# Patient Record
Sex: Female | Born: 1935
Health system: Southern US, Community
[De-identification: ages and names within clinical notes are randomized; demographics above are authoritative.]

## PROBLEM LIST (undated history)

## (undated) ENCOUNTER — Emergency Department (HOSPITAL_COMMUNITY): Admission: EM | Payer: Self-pay | Source: Home / Self Care

## (undated) DIAGNOSIS — E785 Hyperlipidemia, unspecified: Secondary | ICD-10-CM

## (undated) DIAGNOSIS — M1990 Unspecified osteoarthritis, unspecified site: Secondary | ICD-10-CM

## (undated) DIAGNOSIS — I10 Essential (primary) hypertension: Secondary | ICD-10-CM

## (undated) HISTORY — PX: THYROIDECTOMY, PARTIAL: SHX18

## (undated) HISTORY — PX: CHOLECYSTECTOMY: SHX55

## (undated) HISTORY — PX: APPENDECTOMY: SHX54

---

## 2001-11-06 ENCOUNTER — Emergency Department (HOSPITAL_COMMUNITY): Admission: EM | Admit: 2001-11-06 | Discharge: 2001-11-06 | Payer: Self-pay | Admitting: Emergency Medicine

## 2010-08-23 ENCOUNTER — Inpatient Hospital Stay (INDEPENDENT_AMBULATORY_CARE_PROVIDER_SITE_OTHER)
Admission: RE | Admit: 2010-08-23 | Discharge: 2010-08-23 | Disposition: A | Discharge status: Home / Self Care | Payer: Medicare Other | Source: Ambulatory Visit | Attending: Family Medicine | Admitting: Family Medicine

## 2010-08-23 DIAGNOSIS — E785 Hyperlipidemia, unspecified: Secondary | ICD-10-CM

## 2010-08-23 DIAGNOSIS — I10 Essential (primary) hypertension: Secondary | ICD-10-CM

## 2010-08-23 DIAGNOSIS — F411 Generalized anxiety disorder: Secondary | ICD-10-CM

## 2010-08-23 LAB — POCT I-STAT, CHEM 8
BUN: 12 mg/dL (ref 6–23)
Chloride: 102 mEq/L (ref 96–112)
Creatinine, Ser: 1 mg/dL (ref 0.50–1.35)
Potassium: 3.8 mEq/L (ref 3.5–5.1)
Sodium: 142 mEq/L (ref 135–145)

## 2010-12-24 ENCOUNTER — Emergency Department (HOSPITAL_COMMUNITY)
Admission: EM | Admit: 2010-12-24 | Discharge: 2010-12-24 | Disposition: A | Discharge status: Home / Self Care | Payer: Medicare Other | Attending: Emergency Medicine | Admitting: Emergency Medicine

## 2010-12-24 DIAGNOSIS — M79609 Pain in unspecified limb: Secondary | ICD-10-CM | POA: Insufficient documentation

## 2010-12-24 DIAGNOSIS — E789 Disorder of lipoprotein metabolism, unspecified: Secondary | ICD-10-CM | POA: Insufficient documentation

## 2010-12-24 DIAGNOSIS — M25519 Pain in unspecified shoulder: Secondary | ICD-10-CM | POA: Insufficient documentation

## 2010-12-24 DIAGNOSIS — I10 Essential (primary) hypertension: Secondary | ICD-10-CM | POA: Insufficient documentation

## 2010-12-24 DIAGNOSIS — F411 Generalized anxiety disorder: Secondary | ICD-10-CM | POA: Insufficient documentation

## 2010-12-24 DIAGNOSIS — Z79899 Other long term (current) drug therapy: Secondary | ICD-10-CM | POA: Insufficient documentation

## 2010-12-24 DIAGNOSIS — M62838 Other muscle spasm: Secondary | ICD-10-CM | POA: Insufficient documentation

## 2011-03-15 ENCOUNTER — Emergency Department (INDEPENDENT_AMBULATORY_CARE_PROVIDER_SITE_OTHER)
Admission: EM | Admit: 2011-03-15 | Discharge: 2011-03-15 | Disposition: A | Discharge status: Home / Self Care | Payer: Medicare Other | Source: Home / Self Care | Attending: Emergency Medicine | Admitting: Emergency Medicine

## 2011-03-15 ENCOUNTER — Encounter (HOSPITAL_COMMUNITY): Payer: Self-pay

## 2011-03-15 DIAGNOSIS — M62838 Other muscle spasm: Secondary | ICD-10-CM

## 2011-03-15 DIAGNOSIS — I10 Essential (primary) hypertension: Secondary | ICD-10-CM

## 2011-03-15 DIAGNOSIS — Z760 Encounter for issue of repeat prescription: Secondary | ICD-10-CM

## 2011-03-15 HISTORY — DX: Hyperlipidemia, unspecified: E78.5

## 2011-03-15 HISTORY — DX: Unspecified osteoarthritis, unspecified site: M19.90

## 2011-03-15 HISTORY — DX: Essential (primary) hypertension: I10

## 2011-03-15 MED ORDER — IBUPROFEN 600 MG PO TABS: 600.0000 mg | ORAL_TABLET | Freq: Four times a day (QID) | ORAL | Status: DC | PRN

## 2011-03-15 MED ORDER — HYDROCODONE-ACETAMINOPHEN 5-325 MG PO TABS: 2.0000 | ORAL_TABLET | ORAL | Status: AC | PRN

## 2011-03-15 MED ORDER — VALSARTAN 40 MG PO TABS: 80.0000 mg | ORAL_TABLET | Freq: Every day | ORAL | Status: DC

## 2011-03-15 MED ORDER — MELOXICAM 15 MG PO TABS: 15.0000 mg | ORAL_TABLET | Freq: Every day | ORAL | Status: DC

## 2011-03-15 MED ORDER — METHOCARBAMOL 500 MG PO TABS: 500.0000 mg | ORAL_TABLET | Freq: Four times a day (QID) | ORAL | Status: AC
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
## 2011-08-28 ENCOUNTER — Encounter (HOSPITAL_COMMUNITY): Payer: Self-pay | Admitting: Emergency Medicine

## 2011-08-28 ENCOUNTER — Emergency Department (HOSPITAL_COMMUNITY)
Admission: EM | Admit: 2011-08-28 | Discharge: 2011-08-28 | Disposition: A | Discharge status: Home / Self Care | Payer: Medicare Other | Source: Home / Self Care | Attending: Family Medicine | Admitting: Family Medicine

## 2011-08-28 DIAGNOSIS — M25562 Pain in left knee: Secondary | ICD-10-CM

## 2011-08-28 DIAGNOSIS — M25569 Pain in unspecified knee: Secondary | ICD-10-CM

## 2011-08-28 DIAGNOSIS — G8929 Other chronic pain: Secondary | ICD-10-CM

## 2011-08-28 MED ORDER — CELECOXIB 100 MG PO CAPS: 100.0000 mg | ORAL_CAPSULE | Freq: Two times a day (BID) | ORAL | Status: AC | PRN

## 2011-08-28 MED ORDER — HYDROCODONE-ACETAMINOPHEN 5-500 MG PO TABS: 1.0000 | ORAL_TABLET | Freq: Three times a day (TID) | ORAL | Status: AC | PRN

## 2011-08-28 MED ORDER — ATORVASTATIN CALCIUM 10 MG PO TABS: 10.0000 mg | ORAL_TABLET | Freq: Every day | ORAL | Status: DC

## 2011-08-28 MED ORDER — VALSARTAN 40 MG PO TABS: 80.0000 mg | ORAL_TABLET | Freq: Every day | ORAL | Status: DC

## 2011-08-28 MED ORDER — FOLDING WALKER/ADULT MISC: 1.0000 | Freq: Every day | Status: DC

## 2011-08-28 MED ORDER — NORTRIPTYLINE HCL 10 MG PO CAPS: 10.0000 mg | ORAL_CAPSULE | Freq: Every evening | ORAL | Status: DC | PRN

## 2011-08-28 NOTE — Discharge Instructions (Signed)
Take the prescribed medications as instructed. Be aware that Vicodin can make you drowsy and she should not drive after taking this medication take precautions to avoid falls.  You need to establish care with a primary care provider in [place] who can adjust her medications as needed monitor your symptoms and chronic conditions and provide you with medication refills. See the list below to find a primary care provider and make a followup appointment.    Go to www.goodrx.com to look up your medications. This will give you a list of where you can find your prescriptions at the most affordable prices.   Call [facility]  [phone]  If you have no primary doctor, here are some resources that may be helpful:  Medicaid-accepting [facility] Providers:   - [facility]- [address]      [phone]      Mon-Fri 9am-7pm, Sat 9am-1pm   - [facility]- [address] [place]      [phone]   - [facility]- [address], [address] [place]      [phone]   [facility]- [address]      [phone]   - Renaye Rakers- [address] [place], [address]      [phone]      Only accepts [place] patients       after they have her name applied to their card   Self Pay (no insurance) in [place]:   - Sickle Cell Patients: Dr Willey Blade, [facility]      [address] [place]      [phone]   - [facility] [phone]   - Physician Referral Service- [phone]   - [facility]- [address] [place]      [phone]   [facility]- [address]   - [facility]- see information above      (Speak to [employer] if you do not have insurance)   - [facility]- [address] [place]      [phone]   - [facility] [place]- [address]      [phone]   - [facility]- [address]      [phone]   - Dr Julio Sicks-  [address], [place]      [phone]   - [facility]- [address]      [phone]   - [facility]- [address]      [phone]      Also [address]      [phone]   - [facility]- [address]      [phone]      1st and 3rd Saturday every month, 10am-1pm    Other agencies that provide inexpensive medical care:     [facility]  [phone]    [facility]  [phone]    [facility]  [phone]    [facility]  [phone] [address] [place] [address]    [facility]  [phone]    [facility]  [phone] [facility] [phone]   [address] [place], [place] [address]  Chronic Pain Problems Contact [facility]  [phone] Patients need to be referred by their primary care doctor.  [facility]  [facility]     [employer]                          [facility] Dept. [address] [place]                       [address]      [address] [place] [address]   [phone] (After Hours)  General Information: Finding a doctor when you do not have health insurance can be tricky. Although you are not limited by an insurance plan, you are of course limited by her finances and how much but he can pay out of pocket.  What are your options if you don't have health insurance?   1) Find a Librarian, academic and Pay Out of Pocket Although you won't have to find out who is covered by your insurance plan, it is a good idea to ask around and get recommendations. You will then need to call the office and see if the doctor you have chosen will accept you as a new patient and what types of options they offer for patients who are self-pay. Some doctors offer discounts or will set up payment plans for their patients who do not  have insurance, but you will need to ask so you aren't surprised when you get to your appointment.  2) Contact Your Local Health Department Not all health departments have doctors that can see patients for sick visits, but many do, so it is worth a call to see if yours does. If you don't know where your local health department is, you can check in your phone book. The CDC also has a tool to help you locate your state's health department, and many state websites also have listings of all of their local health departments.  3) Find a Walk-in Clinic If your illness is not likely to be very severe or complicated, you may want to try a walk in clinic. These are popping up all over the country in pharmacies, drugstores, and shopping centers. They're usually staffed by nurse practitioners or physician assistants that have been trained to treat common illnesses and complaints. They're usually fairly quick and inexpensive. However, if you have serious medical issues or chronic medical problems, these are probably not your best option

## 2011-08-28 NOTE — ED Notes (Signed)
Pt here with left knee achy intermit pain and swelling that started x 5 dys ago.denies injury but has arthritis.using otc arthritis cream but not working.

## 2011-08-30 NOTE — ED Provider Notes (Signed)
History     CSN: [id number]  Arrival date & time 08/28/11  1201   First MD Initiated Contact with Patient 08/28/11 1319      Chief Complaint  Patient presents with  . Knee Pain    (Consider location/radiation/quality/duration/timing/severity/associated sxs/prior treatment) HPI Comments: 78 year old female with history of posterior try this and chronic bilateral knee pain. Here complaining of exacerbation of her left knee pain in the last 5 days. Has had a prescription of MOBIC in the past for osteoarthritis pain but are now this and other medications. Able to walk but reported discomfort in left knee. No claudication. States she used to live in [place] where she had a primary care provider but has moved to [place] with her daughter for the last year. No primary care provider here needs refills on her other medications she feels otherwise well. Denies leg swelling, chest pain, shortness of breath or dizziness. Denies h/o GI bleed or ulcers.    Past Medical History  Diagnosis Date  . Hypertension   . Hyperlipidemia   . Arthritis     Past Surgical History  Procedure Date  . Cholecystectomy   . Appendectomy   . Thyroidectomy, partial     No family history on file.  History  Substance Use Topics  . Smoking status: Former Games developer  . Smokeless tobacco: Not on file  . Alcohol Use: No    OB History    Grav Para Term Preterm Abortions TAB SAB Ect Mult Living                  Review of Systems  Constitutional: Negative for fever, chills and fatigue.       10 systems reviewed and  pertinent negative and positive symptoms are as per HPI.     Respiratory: Negative for chest tightness and shortness of breath.   Cardiovascular: Negative for chest pain and leg swelling.  Gastrointestinal: Negative for abdominal pain.  Genitourinary: Negative for dysuria.  Musculoskeletal: Positive for arthralgias.  Skin: Negative for rash.  Neurological: Negative for dizziness and  headaches.  All other systems reviewed and are negative.    Allergies  Review of patient's allergies indicates no known allergies.  Home Medications   Current Outpatient Rx  Name Route Sig Dispense Refill  . ATORVASTATIN CALCIUM 10 MG PO TABS Oral Take 1 tablet (10 mg total) by mouth at bedtime. 30 tablet 0  . CELECOXIB 100 MG PO CAPS Oral Take 1 capsule (100 mg total) by mouth 2 (two) times daily as needed for pain. 20 capsule 0  . HYDROCODONE-ACETAMINOPHEN 5-500 MG PO TABS Oral Take 1 tablet by mouth every 8 (eight) hours as needed for pain. 15 tablet 0  . FOLDING WALKER/ADULT MISC Does not apply 1 Device by Does not apply route daily. Adult foldable rolling walker with seat.  To use as needed. 1 each 0  . NORTRIPTYLINE HCL 10 MG PO CAPS Oral Take 1 capsule (10 mg total) by mouth at bedtime as needed. 30 capsule 0  . VALSARTAN 40 MG PO TABS Oral Take 2 tablets (80 mg total) by mouth daily. 60 tablet 0    BP 138/78  Pulse 88  Temp 98.2 F (36.8 C) (Oral)  Resp 20  SpO2 100%  Physical Exam  Nursing note and vitals reviewed. Constitutional: She is oriented to person, place, and time. She appears well-developed and well-nourished. No distress.  HENT:  Head: Normocephalic and atraumatic.  Eyes: Conjunctivae are normal. Pupils are equal,  round, and reactive to light.  Neck: No JVD present. No thyromegaly present.  Cardiovascular: Normal rate, regular rhythm, normal heart sounds and intact distal pulses.  Exam reveals no gallop and no friction rub.   No murmur heard. Pulmonary/Chest: Effort normal and breath sounds normal.  Abdominal: Soft. Bowel sounds are normal. She exhibits no distension and no mass. There is no tenderness.  Musculoskeletal:       Left knee: no obvious deformity, bruising or swelling. No effusion. No increased temperature or erythema. Diffused tenderness with palpation worse and associated with crepitus with passive flexion and extension.  No laxity. No  patella dislocation. Weight bearing.   Lymphadenopathy:    She has no cervical adenopathy.  Neurological: She is alert and oriented to person, place, and time.  Skin: No rash noted.    ED Course  Procedures (including critical care time)  Labs Reviewed - No data to display No results found.   1. Chronic pain of left knee       MDM  Impress chronic osteoarthritis related pain. Prescribed Celebrex, also refilled (15) Vicodin pills patient has taken this medication in the past, fall precautions were discussed. Diovan was refilled, amitriptyline was changed to nortriptyline as appears to have better safety profile on elders. A prescription for a walker with a seat was provided. Primary care resources list also made available for this patient to establish primary care in [place].        Sharin Grave, MD 08/31/11 [phone]

## 2011-09-15 DIAGNOSIS — F411 Generalized anxiety disorder: Secondary | ICD-10-CM | POA: Diagnosis not present

## 2011-09-15 DIAGNOSIS — M159 Polyosteoarthritis, unspecified: Secondary | ICD-10-CM | POA: Diagnosis not present

## 2011-09-15 DIAGNOSIS — IMO0001 Reserved for inherently not codable concepts without codable children: Secondary | ICD-10-CM | POA: Diagnosis not present

## 2011-09-15 DIAGNOSIS — I10 Essential (primary) hypertension: Secondary | ICD-10-CM | POA: Diagnosis not present

## 2011-09-15 DIAGNOSIS — Z0000 Encounter for general adult medical examination without abnormal findings: Secondary | ICD-10-CM | POA: Diagnosis not present

## 2013-07-29 ENCOUNTER — Encounter: Payer: Self-pay | Admitting: Internal Medicine

## 2013-07-29 ENCOUNTER — Ambulatory Visit (INDEPENDENT_AMBULATORY_CARE_PROVIDER_SITE_OTHER): Payer: Medicare Other | Admitting: Internal Medicine

## 2013-07-29 VITALS — BP 162/78 | HR 110 | Temp 97.8°F | Resp 24 | Ht 66.0 in | Wt 207.0 lb

## 2013-07-29 DIAGNOSIS — R0602 Shortness of breath: Secondary | ICD-10-CM

## 2013-07-29 DIAGNOSIS — I10 Essential (primary) hypertension: Secondary | ICD-10-CM | POA: Diagnosis not present

## 2013-07-29 DIAGNOSIS — R079 Chest pain, unspecified: Secondary | ICD-10-CM

## 2013-07-29 DIAGNOSIS — F411 Generalized anxiety disorder: Secondary | ICD-10-CM

## 2013-07-29 DIAGNOSIS — F410 Panic disorder [episodic paroxysmal anxiety] without agoraphobia: Secondary | ICD-10-CM | POA: Diagnosis not present

## 2013-07-29 DIAGNOSIS — F419 Anxiety disorder, unspecified: Secondary | ICD-10-CM

## 2013-07-29 MED ORDER — ALPRAZOLAM 0.25 MG PO TABS
0.2500 mg | ORAL_TABLET | Freq: Once | ORAL | Status: AC
  Administered 2013-07-29: 0.25 mg via ORAL

## 2013-07-29 MED ORDER — LOSARTAN POTASSIUM-HCTZ 100-12.5 MG PO TABS: 1.0000 | ORAL_TABLET | Freq: Every day | ORAL | Status: DC

## 2013-07-29 MED ORDER — SERTRALINE HCL 50 MG PO TABS: 50.0000 mg | ORAL_TABLET | Freq: Every day | ORAL | Status: DC

## 2013-07-29 MED ORDER — LORAZEPAM 1 MG PO TABS: 1.0000 mg | ORAL_TABLET | Freq: Two times a day (BID) | ORAL | Status: DC
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
## 2013-09-09 ENCOUNTER — Other Ambulatory Visit: Payer: Self-pay | Admitting: Internal Medicine

## 2013-09-15 ENCOUNTER — Other Ambulatory Visit: Payer: Self-pay | Admitting: Internal Medicine

## 2013-10-15 ENCOUNTER — Ambulatory Visit (INDEPENDENT_AMBULATORY_CARE_PROVIDER_SITE_OTHER): Payer: Medicare Other | Admitting: Family Medicine

## 2013-10-15 VITALS — BP 174/70 | HR 88 | Temp 98.2°F | Resp 24 | Ht 67.0 in | Wt 207.0 lb

## 2013-10-15 DIAGNOSIS — I10 Essential (primary) hypertension: Secondary | ICD-10-CM | POA: Diagnosis not present

## 2013-10-15 DIAGNOSIS — R358 Other polyuria: Secondary | ICD-10-CM

## 2013-10-15 DIAGNOSIS — R3589 Other polyuria: Secondary | ICD-10-CM

## 2013-10-15 DIAGNOSIS — N898 Other specified noninflammatory disorders of vagina: Secondary | ICD-10-CM

## 2013-10-15 DIAGNOSIS — F411 Generalized anxiety disorder: Secondary | ICD-10-CM

## 2013-10-15 DIAGNOSIS — G4700 Insomnia, unspecified: Secondary | ICD-10-CM | POA: Diagnosis not present

## 2013-10-15 DIAGNOSIS — L293 Anogenital pruritus, unspecified: Secondary | ICD-10-CM

## 2013-10-15 DIAGNOSIS — E785 Hyperlipidemia, unspecified: Secondary | ICD-10-CM

## 2013-10-15 MED ORDER — AMLODIPINE BESYLATE 5 MG PO TABS: 5.0000 mg | ORAL_TABLET | Freq: Every day | ORAL | Status: DC

## 2013-10-15 MED ORDER — FLUCONAZOLE 150 MG PO TABS: 150.0000 mg | ORAL_TABLET | Freq: Once | ORAL | Status: DC

## 2013-10-15 MED ORDER — SERTRALINE HCL 50 MG PO TABS: 50.0000 mg | ORAL_TABLET | Freq: Every day | ORAL | Status: DC

## 2013-10-15 MED ORDER — ATORVASTATIN CALCIUM 40 MG PO TABS: 40.0000 mg | ORAL_TABLET | Freq: Every day | ORAL | Status: DC

## 2013-10-15 MED ORDER — LORAZEPAM 1 MG PO TABS: 1.0000 mg | ORAL_TABLET | Freq: Two times a day (BID) | ORAL | Status: DC

## 2013-10-15 MED ORDER — AMITRIPTYLINE HCL 150 MG PO TABS: 150.0000 mg | ORAL_TABLET | Freq: Every day | ORAL | Status: DC

## 2013-10-15 MED ORDER — CIPROFLOXACIN HCL 250 MG PO TABS
250.0000 mg | ORAL_TABLET | Freq: Two times a day (BID) | ORAL | Status: DC
Start: 2013-10-15 — End: 2017-05-23

## 2013-10-15 NOTE — Progress Notes (Signed)
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 She moved down here to be close to her son. In June, patient came in for blood pressure check and was given losartan HCT which has resulted in polyuria. She tried discontinuing it and then taking it again but each time she develops urinary frequency. Currently she also has vaginal itching.  Patient denies headache, shortness of breath or fever Patient has a chronic history of anxiety and has run out of her other medications which she's been on for a long time. She would like these refilled. These include Ativan and Elavil.  Objective: Patient is very anxious and somewhat tremulous but comes down during conversation. HEENT: Unremarkable with exception of poor dentition Neck: Supple no adenopathy or bruits Chest: Clear Heart: Regular, rate less than 100, no murmur Abdomen: Soft and nontender   Anxiety state, unspecified - Plan: amitriptyline (ELAVIL) 150 MG tablet, LORazepam (ATIVAN) 1 MG tablet, sertraline (ZOLOFT) 50 MG tablet  Essential hypertension, benign - Plan: amLODipine (NORVASC) 5 MG tablet  Insomnia - Plan: amitriptyline (ELAVIL) 150 MG tablet, LORazepam (ATIVAN) 1 MG tablet  Other and unspecified hyperlipidemia - Plan: atorvastatin (LIPITOR) 40 MG tablet  Hyperlipidemia  Vaginal itching - Plan: fluconazole (DIFLUCAN) 150 MG tablet  Polyuria - Plan: ciprofloxacin (CIPRO) 250 MG tablet  Signed, Elvina SidleKurt Jayelle Page, MD

## 2013-12-04 ENCOUNTER — Telehealth: Payer: Self-pay

## 2013-12-04 DIAGNOSIS — I10 Essential (primary) hypertension: Secondary | ICD-10-CM

## 2013-12-04 NOTE — Telephone Encounter (Signed)
[facility] [address] pharm called to let us know that the pt told him that the Losartan/HCT was making her dizzy and wanted to know if she could stop it and switch to something else. I see where this gave her urinary frequency at her last OV but was unsure if you had discontinued it. Please advise. Thanks

## 2013-12-05 MED ORDER — CHLORTHALIDONE 25 MG PO TABS: 25.0000 mg | ORAL_TABLET | Freq: Every day | ORAL | Status: DC

## 2013-12-05 NOTE — Telephone Encounter (Signed)
Called patient to advise. [phone] number was given to me when I called number in chart (?daughter) Left message for patient to call back.

## 2013-12-05 NOTE — Telephone Encounter (Signed)
Stop the losartan Chlorthalidone 25 will be ordered

## 2013-12-07 NOTE — Telephone Encounter (Signed)
Pt notified. She has already picked up the Chlorthalidone

## 2013-12-07 NOTE — Telephone Encounter (Signed)
LM with a female to have pt CB

## 2014-04-22 ENCOUNTER — Other Ambulatory Visit: Payer: Self-pay | Admitting: Family Medicine

## 2014-04-25 ENCOUNTER — Other Ambulatory Visit: Payer: Self-pay | Admitting: Internal Medicine

## 2014-04-25 ENCOUNTER — Other Ambulatory Visit: Payer: Self-pay | Admitting: Family Medicine

## 2014-04-28 ENCOUNTER — Other Ambulatory Visit: Payer: Self-pay

## 2014-04-28 NOTE — Telephone Encounter (Signed)
[facility]

## 2014-06-30 ENCOUNTER — Other Ambulatory Visit: Payer: Self-pay | Admitting: Internal Medicine

## 2014-08-05 ENCOUNTER — Other Ambulatory Visit: Payer: Self-pay | Admitting: Physician Assistant

## 2014-09-25 ENCOUNTER — Other Ambulatory Visit: Payer: Self-pay | Admitting: Physician Assistant

## 2014-10-15 ENCOUNTER — Other Ambulatory Visit: Payer: Self-pay | Admitting: Physician Assistant

## 2014-10-23 ENCOUNTER — Other Ambulatory Visit: Payer: Self-pay | Admitting: Family Medicine

## 2014-10-26 NOTE — Telephone Encounter (Signed)
Tried to call pt because have given many warnings on RFs that needs OV. Ph was answered and then pt hung up after saying hello. Tried to call back again in case disconnected and pt did not pick up. I LMOM that pt needs to be seen for refills.

## 2014-10-27 ENCOUNTER — Other Ambulatory Visit: Payer: Self-pay | Admitting: Family Medicine

## 2014-10-27 ENCOUNTER — Other Ambulatory Visit: Payer: Self-pay | Admitting: Physician Assistant

## 2014-11-23 ENCOUNTER — Other Ambulatory Visit: Payer: Self-pay | Admitting: Family Medicine

## 2014-11-24 NOTE — Telephone Encounter (Signed)
Dr L, pt hasn't been seen in over a year, has had messages put on last few refills. Do you want to deny w/note to RTC?

## 2015-04-29 ENCOUNTER — Emergency Department (HOSPITAL_COMMUNITY)
Admission: EM | Admit: 2015-04-29 | Discharge: 2015-04-29 | Disposition: A | Discharge status: Home / Self Care | Payer: Medicare Other | Attending: Emergency Medicine | Admitting: Emergency Medicine

## 2015-04-29 ENCOUNTER — Emergency Department (HOSPITAL_COMMUNITY): Payer: Medicare Other

## 2015-04-29 ENCOUNTER — Encounter (HOSPITAL_COMMUNITY): Payer: Self-pay | Admitting: Emergency Medicine

## 2015-04-29 DIAGNOSIS — R05 Cough: Secondary | ICD-10-CM | POA: Insufficient documentation

## 2015-04-29 DIAGNOSIS — R6883 Chills (without fever): Secondary | ICD-10-CM | POA: Insufficient documentation

## 2015-04-29 DIAGNOSIS — R52 Pain, unspecified: Secondary | ICD-10-CM | POA: Diagnosis present

## 2015-04-29 DIAGNOSIS — E785 Hyperlipidemia, unspecified: Secondary | ICD-10-CM | POA: Diagnosis not present

## 2015-04-29 DIAGNOSIS — Z87891 Personal history of nicotine dependence: Secondary | ICD-10-CM | POA: Diagnosis not present

## 2015-04-29 DIAGNOSIS — Z79899 Other long term (current) drug therapy: Secondary | ICD-10-CM | POA: Diagnosis not present

## 2015-04-29 DIAGNOSIS — I10 Essential (primary) hypertension: Secondary | ICD-10-CM | POA: Diagnosis not present

## 2015-04-29 DIAGNOSIS — Z792 Long term (current) use of antibiotics: Secondary | ICD-10-CM | POA: Insufficient documentation

## 2015-04-29 DIAGNOSIS — Z8739 Personal history of other diseases of the musculoskeletal system and connective tissue: Secondary | ICD-10-CM | POA: Insufficient documentation

## 2015-04-29 DIAGNOSIS — N390 Urinary tract infection, site not specified: Secondary | ICD-10-CM | POA: Insufficient documentation

## 2015-04-29 LAB — URINALYSIS, ROUTINE W REFLEX MICROSCOPIC
Glucose, UA: NEGATIVE mg/dL
KETONES UR: NEGATIVE mg/dL
NITRITE: NEGATIVE
PH: 5 (ref 5.0–8.0)
Protein, ur: 30 mg/dL — AB
SPECIFIC GRAVITY, URINE: 1.023 (ref 1.005–1.030)

## 2015-04-29 LAB — URINE MICROSCOPIC-ADD ON

## 2015-04-29 MED ORDER — CEPHALEXIN 500 MG PO CAPS: 500.0000 mg | ORAL_CAPSULE | Freq: Two times a day (BID) | ORAL | Status: DC

## 2015-04-29 MED ORDER — DIPHENHYDRAMINE HCL 25 MG PO CAPS
25.0000 mg | ORAL_CAPSULE | Freq: Once | ORAL | Status: AC
  Administered 2015-04-29: 25 mg via ORAL
  Filled 2015-04-29: qty 1

## 2015-04-29 NOTE — ED Notes (Signed)
Patient is alert and oriented x3.  She was given DC instructions and follow up visit instructions.  Patient gave verbal understanding. She was DC ambulatory under her own power to home.  V/S stable.  He was not showing any signs of distress on DC 

## 2015-04-29 NOTE — ED Provider Notes (Signed)
CSN: [id number]     Arrival date & time 04/29/15  1528 History   First MD Initiated Contact with Patient 04/29/15 1853     Chief Complaint  Patient presents with  . Chills  . Generalized Body Aches   (Consider location/radiation/quality/duration/timing/severity/associated sxs/prior Treatment) HPI 82 y.o. female presents to the Emergency Department today complaining of generalized body aches x 4 days. Associated cough. Notes being very cold at night and then very warm. Pt states that she had a fever of 110F measured last night. Has not tried any OTC medication. No CP/SOB/ABD pain. No N/V/D. No sinus pressure. No congestion. No rhinorrhea. No dysuria. No decrease in fluid intake. Pt able to ambulate. Notes sick contacts. No other symptoms noted.   Past Medical History  Diagnosis Date  . Hypertension   . Hyperlipidemia   . Arthritis    Past Surgical History  Procedure Laterality Date  . Cholecystectomy    . Appendectomy    . Thyroidectomy, partial     History reviewed. No pertinent family history. Social History  Substance Use Topics  . Smoking status: Former Games developer  . Smokeless tobacco: None  . Alcohol Use: No   OB History    No data available     Review of Systems ROS reviewed and all are negative for acute change except as noted in the HPI.  Allergies  Review of patient's allergies indicates no known allergies.  Home Medications   Prior to Admission medications   Medication Sig Start Date End Date Taking? Authorizing Provider  amitriptyline (ELAVIL) 150 MG tablet Take 1 tablet (150 mg total) by mouth at bedtime. 10/15/13   Elvina Sidle, MD  amLODipine (NORVASC) 5 MG tablet Take 1 tablet (5 mg total) by mouth daily. 10/15/13   Elvina Sidle, MD  atorvastatin (LIPITOR) 40 MG tablet TAKE 1 TABLET BY MOUTH ONCE DAILY  "OV NEEDED FOR REFILLS" 10/28/14   Porfirio Oar, PA-C  chlorthalidone (HYGROTON) 25 MG tablet Take 1 tablet (25 mg total) by mouth daily. PATIENT NEEDS  OFFICE VISIT FOR ADDITIONAL REFILLS 04/26/14   Elvina Sidle, MD  ciprofloxacin (CIPRO) 250 MG tablet Take 1 tablet (250 mg total) by mouth 2 (two) times daily. 10/15/13   Elvina Sidle, MD  fluconazole (DIFLUCAN) 150 MG tablet Take 1 tablet (150 mg total) by mouth once. 10/15/13   Elvina Sidle, MD  LORazepam (ATIVAN) 1 MG tablet take 1 tablet by mouth twice a day 11/25/14   Elvina Sidle, MD  losartan-hydrochlorothiazide (HYZAAR) 100-12.5 MG per tablet TAKE 1 TABLET BY MOUTH ONCE DAILY..  "NO MORE REFILLS WITHOUT OV" 10/28/14   Chelle Jeffery, PA-C  sertraline (ZOLOFT) 50 MG tablet TAKE 1 TABLET BY MOUTH ONCE DAILY.  "OV NEEDED FOR REFILLS" 10/28/14   Chelle Jeffery, PA-C  valsartan-hydrochlorothiazide (DIOVAN-HCT) 160-12.5 MG per tablet Take 1 tablet by mouth daily.    Historical Provider, MD   BP 138/60 mmHg  Pulse 74  Temp(Src) 98.6 F (37 C) (Oral)  Resp 18  SpO2 94%   Physical Exam  Constitutional: She is oriented to person, place, and time. She appears well-developed and well-nourished.  HENT:  Head: Normocephalic and atraumatic.  Right Ear: Tympanic membrane, external ear and ear canal normal.  Left Ear: Tympanic membrane, external ear and ear canal normal.  Nose: Nose normal.  Mouth/Throat: Uvula is midline, oropharynx is clear and moist and mucous membranes are normal.  Eyes: EOM are normal.  Cardiovascular: Normal rate, regular rhythm, S1 normal, S2 normal, normal heart sounds,  intact distal pulses and normal pulses.   Pulmonary/Chest: Effort normal and breath sounds normal. She has no decreased breath sounds. She has no wheezes. She has no rhonchi. She has no rales.  Abdominal: Soft. Normal appearance and bowel sounds are normal. There is no tenderness. There is no rigidity, no rebound, no guarding, no CVA tenderness, no tenderness at McBurney's point and negative Murphy's sign.  Musculoskeletal: Normal range of motion.  Neurological: She is alert and oriented to person,  place, and time.  Skin: Skin is warm and dry.  Psychiatric: She has a normal mood and affect. Her behavior is normal. Thought content normal.  Nursing note and vitals reviewed.   ED Course  Procedures (including critical care time) Labs Review Labs Reviewed  URINALYSIS, ROUTINE W REFLEX MICROSCOPIC (NOT AT [facility]) - Abnormal; Notable for the following:    Color, Urine AMBER (*)    APPearance TURBID (*)    Hgb urine dipstick SMALL (*)    Bilirubin Urine SMALL (*)    Protein, ur 30 (*)    Leukocytes, UA MODERATE (*)    All other components within normal limits  URINE MICROSCOPIC-ADD ON - Abnormal; Notable for the following:    Squamous Epithelial / LPF 6-30 (*)    Bacteria, UA MANY (*)    Casts HYALINE CASTS (*)    All other components within normal limits  URINE CULTURE    Imaging Review No results found. I have personally reviewed and evaluated these images and lab results as part of my medical decision-making.   EKG Interpretation None      MDM  I have reviewed and evaluated the relevant laboratory values. I have reviewed and evaluated the relevant imaging studies. I have reviewed the relevant previous healthcare records. I obtained HPI from historian. Patient discussed with supervising physician  ED Course:  Assessment: Pt is a 81yF presents with URI symptoms . On exam, pt in NAD. VSS. Afebrile. Lungs CTA, Heart RRR. Abdomen nontender/soft. Pt CXR negative for acute infiltrate. Patients symptoms are consistent with URI, likely viral etiology. UA showed UTI. Will treat with ABX. Pt will be discharged with symptomatic treatment.  Verbalizes understanding and is agreeable with plan. Pt is hemodynamically stable & in NAD prior to dc.  Disposition/Plan:  DC Home Additional Verbal discharge instructions given and discussed with patient.  Pt Instructed to f/u with PCP in the next week for evaluation and treatment of symptoms. Return precautions given Pt acknowledges and  agrees with plan  Supervising Physician Alvira Monday, MD   Final diagnoses:  UTI (lower urinary tract infection)      Audry Pili, PA-C 04/29/15 2020  Alvira Monday, MD 04/30/15 1404

## 2015-04-29 NOTE — ED Notes (Signed)
Pt c/o body aches, chills, sweats. Pt states her fever was 110 F last night. Clarified with patient, pt repeats same number. Pt has not taken antipyretics today. No abdominal pain, emesis, diarrhea.

## 2015-04-29 NOTE — Discharge Instructions (Signed)
Please read and follow all provided instructions.  Your diagnoses today include:  1. UTI (lower urinary tract infection)    Tests performed today include:  Urine test - suggests that you have an infection in your bladder  Vital signs. See below for your results today.   Medications prescribed:   Keflex. Take as Prescribed.   Home care instructions:  Follow any educational materials contained in this packet.  Follow-up instructions: Please follow-up with your primary care provider within the week if symptoms persist.  Return instructions:   Please return to the Emergency Department if you experience worsening symptoms.   Return with fever, worsening pain, persistent vomiting, worsening pain in your back.   Please return if you have any other emergent concerns.  Additional Information:  Your vital signs today were: BP 138/60 mmHg   Pulse 74   Temp(Src) 98.6 F (37 C) (Oral)   Resp 18   SpO2 94% If your blood pressure (BP) was elevated above 135/85 this visit, please have this repeated by your doctor within one month. --------------

## 2015-05-01 LAB — URINE CULTURE

## 2015-05-04 ENCOUNTER — Telehealth: Payer: Self-pay

## 2015-05-04 NOTE — Telephone Encounter (Signed)
PA approved for lorazepam through 05/03/2016. Notified pharm and also message that pt needs OV for any more med RFs.

## 2015-05-08 ENCOUNTER — Encounter (HOSPITAL_COMMUNITY): Payer: Self-pay | Admitting: Emergency Medicine

## 2015-05-08 ENCOUNTER — Emergency Department (HOSPITAL_COMMUNITY): Payer: Medicare Other

## 2015-05-08 ENCOUNTER — Emergency Department (HOSPITAL_COMMUNITY)
Admission: EM | Admit: 2015-05-08 | Discharge: 2015-05-09 | Disposition: A | Discharge status: Home / Self Care | Payer: Medicare Other | Attending: Emergency Medicine | Admitting: Emergency Medicine

## 2015-05-08 DIAGNOSIS — M1990 Unspecified osteoarthritis, unspecified site: Secondary | ICD-10-CM | POA: Diagnosis not present

## 2015-05-08 DIAGNOSIS — Z87891 Personal history of nicotine dependence: Secondary | ICD-10-CM | POA: Insufficient documentation

## 2015-05-08 DIAGNOSIS — R630 Anorexia: Secondary | ICD-10-CM | POA: Insufficient documentation

## 2015-05-08 DIAGNOSIS — I10 Essential (primary) hypertension: Secondary | ICD-10-CM | POA: Diagnosis not present

## 2015-05-08 DIAGNOSIS — F419 Anxiety disorder, unspecified: Secondary | ICD-10-CM | POA: Insufficient documentation

## 2015-05-08 DIAGNOSIS — E785 Hyperlipidemia, unspecified: Secondary | ICD-10-CM | POA: Diagnosis not present

## 2015-05-08 DIAGNOSIS — I951 Orthostatic hypotension: Secondary | ICD-10-CM | POA: Diagnosis not present

## 2015-05-08 DIAGNOSIS — R109 Unspecified abdominal pain: Secondary | ICD-10-CM | POA: Insufficient documentation

## 2015-05-08 DIAGNOSIS — Z79899 Other long term (current) drug therapy: Secondary | ICD-10-CM | POA: Diagnosis not present

## 2015-05-08 DIAGNOSIS — Z792 Long term (current) use of antibiotics: Secondary | ICD-10-CM | POA: Insufficient documentation

## 2015-05-08 DIAGNOSIS — J159 Unspecified bacterial pneumonia: Secondary | ICD-10-CM | POA: Diagnosis not present

## 2015-05-08 DIAGNOSIS — R05 Cough: Secondary | ICD-10-CM | POA: Diagnosis present

## 2015-05-08 DIAGNOSIS — J189 Pneumonia, unspecified organism: Secondary | ICD-10-CM

## 2015-05-08 LAB — COMPREHENSIVE METABOLIC PANEL
ALK PHOS: 97 U/L (ref 38–126)
ALT: 22 U/L (ref 14–54)
ANION GAP: 13 (ref 5–15)
AST: 30 U/L (ref 15–41)
Albumin: 3.2 g/dL — ABNORMAL LOW (ref 3.5–5.0)
BILIRUBIN TOTAL: 1.2 mg/dL (ref 0.3–1.2)
BUN: 12 mg/dL (ref 6–20)
CALCIUM: 9.1 mg/dL (ref 8.9–10.3)
CO2: 24 mmol/L (ref 22–32)
Chloride: 103 mmol/L (ref 101–111)
Creatinine, Ser: 1.02 mg/dL — ABNORMAL HIGH (ref 0.44–1.00)
GFR, EST AFRICAN AMERICAN: 59 mL/min — AB (ref >60)
GFR, EST NON AFRICAN AMERICAN: 51 mL/min — AB (ref >60)
Glucose, Bld: 116 mg/dL — ABNORMAL HIGH (ref 65–99)
Potassium: 3.8 mmol/L (ref 3.5–5.1)
Sodium: 140 mmol/L (ref 135–145)
TOTAL PROTEIN: 8 g/dL (ref 6.5–8.1)

## 2015-05-08 LAB — CBC
HCT: 39.5 % (ref 36.0–46.0)
HEMOGLOBIN: 13 g/dL (ref 12.0–15.0)
MCH: 28.9 pg (ref 26.0–34.0)
MCHC: 32.9 g/dL (ref 30.0–36.0)
MCV: 87.8 fL (ref 78.0–100.0)
Platelets: 514 10*3/uL — ABNORMAL HIGH (ref 150–400)
RBC: 4.5 MIL/uL (ref 3.87–5.11)
RDW: 14.7 % (ref 11.5–15.5)
WBC: 10.4 10*3/uL (ref 4.0–10.5)

## 2015-05-08 LAB — LIPASE, BLOOD: Lipase: 18 U/L (ref 11–51)

## 2015-05-08 MED ORDER — ACETAMINOPHEN 325 MG PO TABS
650.0000 mg | ORAL_TABLET | Freq: Once | ORAL | Status: AC
  Administered 2015-05-08: 650 mg via ORAL
  Filled 2015-05-08: qty 2

## 2015-05-08 MED ORDER — SODIUM CHLORIDE 0.9 % IV BOLUS (SEPSIS)
1000.0000 mL | Freq: Once | INTRAVENOUS | Status: AC
  Administered 2015-05-08: 1000 mL via INTRAVENOUS

## 2015-05-08 MED ORDER — SODIUM CHLORIDE 0.9 % IV BOLUS (SEPSIS)
500.0000 mL | Freq: Once | INTRAVENOUS | Status: AC
  Administered 2015-05-08: 500 mL via INTRAVENOUS

## 2015-05-08 NOTE — ED Provider Notes (Signed)
CSN: [id number]     Arrival date & time 05/08/15  1418 History   First MD Initiated Contact with Patient 05/08/15 1905     Chief Complaint  Patient presents with  . Eating Disorder  . Cough  . Abdominal Pain     (Consider location/radiation/quality/duration/timing/severity/associated sxs/prior Treatment) HPI Comments: 82 y/o F with hx of HTN, HL comes in with cc of anorexia, dizziness, abd pain and cough. Pt has had some uri like symptoms that started 2 weeks ago. Pt had a neg workup including CXR, CT, and basic labs. UA was + and so patient was started on keflex. After d/c, pt started having diarrhea - she stopped her meds, including antibiotics.  URI has improved, excepot for continued cough with yellow phlegm. Pt now has poor po intake, no appetite. Pt has dizziness with ambulation and with getting up. Pt has abd pain with cough, diarrhea has resolved. No dysuria, hematuria. Pt has more weakness, unable to sleep at night and she is having anxiety.   ROS 10 Systems reviewed and are negative for acute change except as noted in the HPI.     Patient is a 82 y.o. female presenting with cough and abdominal pain. The history is provided by the patient.  Cough Abdominal Pain Associated symptoms: cough     Past Medical History  Diagnosis Date  . Hypertension   . Hyperlipidemia   . Arthritis    Past Surgical History  Procedure Laterality Date  . Cholecystectomy    . Appendectomy    . Thyroidectomy, partial     No family history on file. Social History  Substance Use Topics  . Smoking status: Former Games developer  . Smokeless tobacco: None  . Alcohol Use: No   OB History    No data available     Review of Systems  Respiratory: Positive for cough.   Gastrointestinal: Positive for abdominal pain.      Allergies  Review of patient's allergies indicates no known allergies.  Home Medications   Prior to Admission medications   Medication Sig Start Date End Date Taking?  Authorizing Provider  atorvastatin (LIPITOR) 40 MG tablet TAKE 1 TABLET BY MOUTH ONCE DAILY  "OV NEEDED FOR REFILLS" 10/28/14  Yes Chelle Jeffery, PA-C  cephALEXin (KEFLEX) 500 MG capsule Take 1 capsule (500 mg total) by mouth 2 (two) times daily. 04/29/15  Yes Audry Pili, PA-C  losartan-hydrochlorothiazide (HYZAAR) 100-12.5 MG per tablet TAKE 1 TABLET BY MOUTH ONCE DAILY..  "NO MORE REFILLS WITHOUT OV" 10/28/14  Yes Chelle Jeffery, PA-C  sertraline (ZOLOFT) 50 MG tablet TAKE 1 TABLET BY MOUTH ONCE DAILY.  "OV NEEDED FOR REFILLS" 10/28/14  Yes Chelle Jeffery, PA-C  amitriptyline (ELAVIL) 150 MG tablet Take 1 tablet (150 mg total) by mouth at bedtime. 10/15/13   Elvina Sidle, MD  amLODipine (NORVASC) 5 MG tablet Take 1 tablet (5 mg total) by mouth daily. 10/15/13   Elvina Sidle, MD  benzonatate (TESSALON) 100 MG capsule Take 1 capsule (100 mg total) by mouth every 8 (eight) hours. 05/09/15   Derwood Kaplan, MD  chlorthalidone (HYGROTON) 25 MG tablet Take 1 tablet (25 mg total) by mouth daily. PATIENT NEEDS OFFICE VISIT FOR ADDITIONAL REFILLS 04/26/14   Elvina Sidle, MD  ciprofloxacin (CIPRO) 250 MG tablet Take 1 tablet (250 mg total) by mouth 2 (two) times daily. 10/15/13   Elvina Sidle, MD  fluconazole (DIFLUCAN) 150 MG tablet Take 1 tablet (150 mg total) by mouth once. 10/15/13   Elvina Sidle,  MD  levofloxacin (LEVAQUIN) 500 MG tablet Take 1 tablet (500 mg total) by mouth daily. 05/09/15   Derwood Kaplan, MD  LORazepam (ATIVAN) 1 MG tablet Take 1 tablet (1 mg total) by mouth at bedtime as needed for anxiety. 05/09/15   Derwood Kaplan, MD  ondansetron (ZOFRAN ODT) 4 MG disintegrating tablet Take 1 tablet (4 mg total) by mouth every 8 (eight) hours as needed for nausea or vomiting. 05/09/15   Jatin Naumann, MD   BP 120/39 mmHg  Pulse 78  Temp(Src) 98 F (36.7 C) (Oral)  Resp 18  Ht  (1.626 m)  Wt 200 lb (90.719 kg)  BMI 34.31 kg/m2  SpO2 92% Physical Exam  Constitutional: She is  oriented to person, place, and time. She appears well-developed.  HENT:  Head: Normocephalic and atraumatic.  Dry mucosa  Eyes: Conjunctivae and EOM are normal. Pupils are equal, round, and reactive to light.  Neck: Normal range of motion. Neck supple.  Cardiovascular: Normal rate, regular rhythm and normal heart sounds.   Pulmonary/Chest: Effort normal and breath sounds normal. No respiratory distress. She has no wheezes.  Diffuse rhonchi  Abdominal: Soft. Bowel sounds are normal. She exhibits no distension. There is tenderness. There is no rebound and no guarding.  Left sided mild tenderness  Neurological: She is alert and oriented to person, place, and time.  Skin: Skin is warm and dry.  Nursing note and vitals reviewed.   ED Course  Procedures (including critical care time) Labs Review Labs Reviewed  COMPREHENSIVE METABOLIC PANEL - Abnormal; Notable for the following:    Glucose, Bld 116 (*)    Creatinine, Ser 1.02 (*)    Albumin 3.2 (*)    GFR calc non Af Amer 51 (*)    GFR calc Af Amer 59 (*)    All other components within normal limits  CBC - Abnormal; Notable for the following:    Platelets 514 (*)    All other components within normal limits  URINE CULTURE  LIPASE, BLOOD  URINALYSIS, ROUTINE W REFLEX MICROSCOPIC (NOT AT [facility])    Imaging Review Dg Chest Port 1 View  05/08/2015  CLINICAL DATA:  Cough, abdominal pain EXAM: PORTABLE CHEST 1 VIEW COMPARISON:  04/29/2015 FINDINGS: Airspace opacities are noted in both upper lobes concerning for pneumonia. Mild cardiomegaly. No effusions. No acute bony abnormality. IMPRESSION: New bilateral upper lobe airspace opacities, left greater than right concerning for pneumonia. Electronically Signed   By: Charlett Nose M.D.   On: 05/08/2015 23:33   I have personally reviewed and evaluated these images and lab results as part of my medical decision-making.   EKG Interpretation   Date/Time:  Sunday May 09 2015 00:20:06  EST Ventricular Rate:  79 PR Interval:  167 QRS Duration: 94 QT Interval:  395 QTC Calculation: 453 R Axis:   62 Text Interpretation:  Sinus rhythm Borderline T abnormalities, anterior  leads No acute changes normal interval Confirmed by Rhunette Croft, MD, Jesusita Jocelyn  [phone]) on 05/09/2015 12:35:03 AM      MDM   Final diagnoses:  Orthostatic hypotension  CAP (community acquired pneumonia)    Pt comes in with cc of cough. Pt also c/o anorexia and feeling dizzy.  + orthostatics - oral fluid challenge started by me, pt has passed. IV hydration done -1.5 liters.  Pt not taking uti meds - possibly symptoms getting worse due to uti. Cultures from that visit are normal however. We also got CXR for her rhonchi and cough with new  phlegm - and pt has a CAP.  CURB65 is 1 and PSI score shows pt is class II - both suggesting outpatient workup is appropriate. Will give levaquin.  Pt and son informed of strict ER return precautions along with the diagnosis - they are comfortable with the plan.     Derwood KaplanAnkit Artavia Jeanlouis, MD 05/09/15 [phone]

## 2015-05-08 NOTE — ED Notes (Signed)
Patient not in room yet 

## 2015-05-08 NOTE — ED Notes (Addendum)
Pt c/o not eating for 4 days, coughing and abdominal pain ongoing since beginning of month. Pt seen at [facility] for the same and was given antibiotics (keflex), but caused diarrhea. Pt has not been taking her home medications for 4 days.

## 2015-05-08 NOTE — ED Notes (Signed)
Called Dr. Shyrl NumbersNanavanti in regards to patient's complaint of headache. MD gives verbal order of 650mg  of tylenol.

## 2015-05-08 NOTE — ED Notes (Signed)
Ginger ale and crackers given as po challenge.

## 2015-05-08 NOTE — ED Notes (Signed)
Reminded patient urine sample is needed.  

## 2015-05-09 MED ORDER — LEVOFLOXACIN 500 MG PO TABS
500.0000 mg | ORAL_TABLET | Freq: Once | ORAL | Status: AC
  Administered 2015-05-09: 500 mg via ORAL
  Filled 2015-05-09: qty 1

## 2015-05-09 MED ORDER — LORAZEPAM 1 MG PO TABS: 1.0000 mg | ORAL_TABLET | Freq: Every evening | ORAL | Status: DC | PRN

## 2015-05-09 MED ORDER — LEVOFLOXACIN 500 MG PO TABS
500.0000 mg | ORAL_TABLET | Freq: Every day | ORAL | Status: DC
Start: 2015-05-09 — End: 2017-05-23

## 2015-05-09 MED ORDER — LORAZEPAM 1 MG PO TABS
1.0000 mg | ORAL_TABLET | Freq: Once | ORAL | Status: AC
  Administered 2015-05-09: 1 mg via ORAL
  Filled 2015-05-09: qty 1

## 2015-05-09 MED ORDER — BENZONATATE 100 MG PO CAPS: 100.0000 mg | ORAL_CAPSULE | Freq: Three times a day (TID) | ORAL | Status: DC

## 2015-05-09 MED ORDER — ONDANSETRON 4 MG PO TBDP: 4.0000 mg | ORAL_TABLET | Freq: Three times a day (TID) | ORAL | Status: DC | PRN

## 2015-05-09 NOTE — Discharge Instructions (Signed)
You have a pneumonia. Take the antibitoics as prescribed.  Please return to the ER if your symptoms worsen; you have increased shortness of breath, fevers, chills, inability to keep any medications down, confusion. Otherwise see the outpatient doctor as requested.    Community-Acquired Pneumonia, Adult Pneumonia is an infection of the lungs. There are different types of pneumonia. One type can develop while a person is in a hospital. A different type, called community-acquired pneumonia, develops in people who are not, or have not recently been, in the hospital or other health care facility.  CAUSES Pneumonia may be caused by bacteria, viruses, or funguses. Community-acquired pneumonia is often caused by Streptococcus pneumonia bacteria. These bacteria are often passed from one person to another by breathing in droplets from the cough or sneeze of an infected person. RISK FACTORS The condition is more likely to develop in:  People who havechronic diseases, such as chronic obstructive pulmonary disease (COPD), asthma, congestive heart failure, cystic fibrosis, diabetes, or kidney disease.  People who haveearly-stage or late-stage HIV.  People who havesickle cell disease.  People who havehad their spleen removed (splenectomy).  People who havepoor Administrator.  People who havemedical conditions that increase the risk of breathing in (aspirating) secretions their own mouth and nose.   People who havea weakened immune system (immunocompromised).  People who smoke.  People whotravel to areas where pneumonia-causing germs commonly exist.  People whoare around animal habitats or animals that have pneumonia-causing germs, including birds, bats, rabbits, cats, and farm animals. SYMPTOMS Symptoms of this condition include:  Adry cough.  A wet (productive) cough.  Fever.  Sweating.  Chest pain, especially when breathing deeply or coughing.  Rapid breathing or  difficulty breathing.  Shortness of breath.  Shaking chills.  Fatigue.  Muscle aches. DIAGNOSIS Your health care provider will take a medical history and perform a physical exam. You may also have other tests, including:  Imaging studies of your chest, including X-rays.  Tests to check your blood oxygen level and other blood gases.  Other tests on blood, mucus (sputum), fluid around your lungs (pleural fluid), and urine. If your pneumonia is severe, other tests may be done to identify the specific cause of your illness. TREATMENT The type of treatment that you receive depends on many factors, such as the cause of your pneumonia, the medicines you take, and other medical conditions that you have. For most adults, treatment and recovery from pneumonia may occur at home. In some cases, treatment must happen in a hospital. Treatment may include:  Antibiotic medicines, if the pneumonia was caused by bacteria.  Antiviral medicines, if the pneumonia was caused by a virus.  Medicines that are given by mouth or through an IV tube.  Oxygen.  Respiratory therapy. Although rare, treating severe pneumonia may include:  Mechanical ventilation. This is done if you are not breathing well on your own and you cannot maintain a safe blood oxygen level.  Thoracentesis. This procedureremoves fluid around one lung or both lungs to help you breathe better. HOME CARE INSTRUCTIONS  Take over-the-counter and prescription medicines only as told by your health care provider.  Only takecough medicine if you are losing sleep. Understand that cough medicine can prevent your body's natural ability to remove mucus from your lungs.  If you were prescribed an antibiotic medicine, take it as told by your health care provider. Do not stop taking the antibiotic even if you start to feel better.  Sleep in a semi-upright position at night.  Try sleeping in a reclining chair, or place a few pillows under your  head.  Do not use tobacco products, including cigarettes, chewing tobacco, and e-cigarettes. If you need help quitting, ask your health care provider.  Drink enough water to keep your urine clear or pale yellow. This will help to thin out mucus secretions in your lungs. PREVENTION There are ways that you can decrease your risk of developing community-acquired pneumonia. Consider getting a pneumococcal vaccine if:  You are older than 80 years of age.  You are older than 80 years of age and are undergoing cancer treatment, have chronic lung disease, or have other medical conditions that affect your immune system. Ask your health care provider if this applies to you. There are different types and schedules of pneumococcal vaccines. Ask your health care provider which vaccination option is best for you. You may also prevent community-acquired pneumonia if you take these actions:  Get an influenza vaccine every year. Ask your health care provider which type of influenza vaccine is best for you.  Go to the dentist on a regular basis.  Wash your hands often. Use hand sanitizer if soap and water are not available. SEEK MEDICAL CARE IF:  You have a fever.  You are losing sleep because you cannot control your cough with cough medicine. SEEK IMMEDIATE MEDICAL CARE IF:  You have worsening shortness of breath.  You have increased chest pain.  Your sickness becomes worse, especially if you are an older adult or have a weakened immune system.  You cough up blood.   This information is not intended to replace advice given to you by your health care provider. Make sure you discuss any questions you have with your health care provider.   Document Released: 02/13/2005 Document Revised: 11/04/2014 Document Reviewed: 06/10/2014 Elsevier Interactive Patient Education 2016 Elsevier Inc. Dehydration, Adult Dehydration is a condition in which you do not have enough fluid or water in your body. It  happens when you take in less fluid than you lose. Vital organs such as the kidneys, brain, and heart cannot function without a proper amount of fluids. Any loss of fluids from the body can cause dehydration.  Dehydration can range from mild to severe. This condition should be treated right away to help prevent it from becoming severe. CAUSES  This condition may be caused by:  Vomiting.  Diarrhea.  Excessive sweating, such as when exercising in hot or humid weather.  Not drinking enough fluid during strenuous exercise or during an illness.  Excessive urine output.  Fever.  Certain medicines. RISK FACTORS This condition is more likely to develop in:  People who are taking certain medicines that cause the body to lose excess fluid (diuretics).   People who have a chronic illness, such as diabetes, that may increase urination.  Older adults.   People who live at high altitudes.   People who participate in endurance sports.  SYMPTOMS  Mild Dehydration  Thirst.  Dry lips.  Slightly dry mouth.  Dry, warm skin. Moderate Dehydration  Very dry mouth.   Muscle cramps.   Dark urine and decreased urine production.   Decreased tear production.   Headache.   Light-headedness, especially when you stand up from a sitting position.  Severe Dehydration  Changes in skin.   Cold and clammy skin.   Skin does not spring back quickly when lightly pinched and released.   Changes in body fluids.   Extreme thirst.   No tears.  Not able to sweat when body temperature is high, such as in hot weather.   Minimal urine production.   Changes in vital signs.   Rapid, weak pulse (more than 100 beats per minute when you are sitting still).   Rapid breathing.   Low blood pressure.   Other changes.   Sunken eyes.   Cold hands and feet.   Confusion.  Lethargy and difficulty being awakened.  Fainting (syncope).   Short-term weight loss.    Unconsciousness. DIAGNOSIS  This condition may be diagnosed based on your symptoms. You may also have tests to determine how severe your dehydration is. These tests may include:   Urine tests.   Blood tests.  TREATMENT  Treatment for this condition depends on the severity. Mild or moderate dehydration can often be treated at home. Treatment should be started right away. Do not wait until dehydration becomes severe. Severe dehydration needs to be treated at the hospital. Treatment for Mild Dehydration  Drinking plenty of water to replace the fluid you have lost.   Replacing minerals in your blood (electrolytes) that you may have lost.  Treatment for Moderate Dehydration  Consuming oral rehydration solution (ORS). Treatment for Severe Dehydration  Receiving fluid through an IV tube.   Receiving electrolyte solution through a feeding tube that is passed through your nose and into your stomach (nasogastric tube or NG tube).  Correcting any abnormalities in electrolytes. HOME CARE INSTRUCTIONS   Drink enough fluid to keep your urine clear or pale yellow.   Drink water or fluid slowly by taking small sips. You can also try sucking on ice cubes.  Have food or beverages that contain electrolytes. Examples include bananas and sports drinks.  Take over-the-counter and prescription medicines only as told by your health care provider.   Prepare ORS according to the manufacturer's instructions. Take sips of ORS every 5 minutes until your urine returns to normal.  If you have vomiting or diarrhea, continue to try to drink water, ORS, or both.   If you have diarrhea, avoid:   Beverages that contain caffeine.   Fruit juice.   Milk.   Carbonated soft drinks.  Do not take salt tablets. This can lead to the condition of having too much sodium in your body (hypernatremia).  SEEK MEDICAL CARE IF:  You cannot eat or drink without vomiting.  You have had moderate  diarrhea during a period of more than 24 hours.  You have a fever. SEEK IMMEDIATE MEDICAL CARE IF:   You have extreme thirst.  You have severe diarrhea.  You have not urinated in 6-8 hours, or you have urinated only a small amount of very dark urine.  You have shriveled skin.  You are dizzy, confused, or both.   This information is not intended to replace advice given to you by your health care provider. Make sure you discuss any questions you have with your health care provider.   Document Released: 02/13/2005 Document Revised: 11/04/2014 Document Reviewed: 07/01/2014 Elsevier Interactive Patient Education [employer]2016 Elsevier Inc.

## 2015-05-09 NOTE — ED Notes (Signed)
Dr. Shyrl Numbersnanavanti reports urine sample not needed as levaquin will cover.

## 2017-03-02 DIAGNOSIS — I10 Essential (primary) hypertension: Secondary | ICD-10-CM | POA: Diagnosis not present

## 2017-03-02 DIAGNOSIS — R69 Illness, unspecified: Secondary | ICD-10-CM | POA: Diagnosis not present

## 2017-03-02 DIAGNOSIS — E785 Hyperlipidemia, unspecified: Secondary | ICD-10-CM | POA: Diagnosis not present

## 2017-05-23 ENCOUNTER — Encounter (INDEPENDENT_AMBULATORY_CARE_PROVIDER_SITE_OTHER): Payer: Self-pay | Admitting: Physician Assistant

## 2017-05-23 ENCOUNTER — Other Ambulatory Visit: Payer: Self-pay

## 2017-05-23 ENCOUNTER — Ambulatory Visit (INDEPENDENT_AMBULATORY_CARE_PROVIDER_SITE_OTHER): Payer: Medicare HMO | Admitting: Physician Assistant

## 2017-05-23 VITALS — BP 149/79 | HR 85 | Temp 97.6°F | Ht 66.5 in | Wt 209.4 lb

## 2017-05-23 DIAGNOSIS — E7841 Elevated Lipoprotein(a): Secondary | ICD-10-CM

## 2017-05-23 DIAGNOSIS — F411 Generalized anxiety disorder: Secondary | ICD-10-CM

## 2017-05-23 DIAGNOSIS — M25562 Pain in left knee: Secondary | ICD-10-CM | POA: Diagnosis not present

## 2017-05-23 DIAGNOSIS — Z131 Encounter for screening for diabetes mellitus: Secondary | ICD-10-CM

## 2017-05-23 DIAGNOSIS — I10 Essential (primary) hypertension: Secondary | ICD-10-CM

## 2017-05-23 DIAGNOSIS — G8929 Other chronic pain: Secondary | ICD-10-CM

## 2017-05-23 DIAGNOSIS — R69 Illness, unspecified: Secondary | ICD-10-CM | POA: Diagnosis not present

## 2017-05-23 LAB — POCT GLYCOSYLATED HEMOGLOBIN (HGB A1C): Hemoglobin A1C: 5.5

## 2017-05-23 MED ORDER — ESCITALOPRAM OXALATE 10 MG PO TABS: 10.0000 mg | ORAL_TABLET | Freq: Every day | ORAL | 2 refills | Status: DC

## 2017-05-23 MED ORDER — CLONAZEPAM 1 MG PO TABS: 1.0000 mg | ORAL_TABLET | Freq: Every day | ORAL | 0 refills | Status: DC

## 2017-05-23 MED ORDER — AMLODIPINE BESYLATE 10 MG PO TABS: 10.0000 mg | ORAL_TABLET | Freq: Every day | ORAL | 1 refills | Status: DC

## 2017-05-23 MED ORDER — NAPROXEN SODIUM 220 MG PO TABS: 220.0000 mg | ORAL_TABLET | Freq: Two times a day (BID) | ORAL | 0 refills | Status: DC | PRN

## 2017-05-23 NOTE — Progress Notes (Signed)
Patient states that her lisinopril-HCTZ makes her dizzy she has not taken it today. Last dose was on yesterday

## 2017-05-23 NOTE — Progress Notes (Signed)
Subjective:  Patient ID: Leah Anderson, female    DOB: [birth year]  Age: 84 y.o. MRN: [id number]  CC:    HPI Leah Anderson is a 84 y.o. female with a medical history of HTN, HLD, GAD, OA L knee, essential tremor, poor memory, insomnia, orthostatic hypotension, and CAP presents as a new patient after having been lost to f/u at [facility]. Patient would like management of her HTN, HLD, and anxiety. She is currently taking Lorazepam for anxiety. Needs Lorazepam for anxiety and insomnia. Had at one point taken Sertraline and Amitriptyline but does not remember if efficacious. Has Atorvastatin but will need refill soon. Reportedly controlling HLD. Hyzaar was prescribed but pt is not taking due to lightheadedness when taking this medication. Does not endorse CP, palpitations, SOB, HA, tingling, numbness, abdominal pain, f/c/n/v, rash, or GI/GU sxs.      Outpatient Medications Prior to Visit  Medication Sig Dispense Refill  . amitriptyline (ELAVIL) 150 MG tablet Take 1 tablet (150 mg total) by mouth at bedtime. 30 tablet 5  . atorvastatin (LIPITOR) 40 MG tablet TAKE 1 TABLET BY MOUTH ONCE DAILY  "OV NEEDED FOR REFILLS" 30 tablet 0  . LORazepam (ATIVAN) 1 MG tablet Take 1 tablet (1 mg total) by mouth at bedtime as needed for anxiety. 8 tablet 0  . losartan-hydrochlorothiazide (HYZAAR) 100-12.5 MG per tablet TAKE 1 TABLET BY MOUTH ONCE DAILY..  "NO MORE REFILLS WITHOUT OV" 15 tablet 0  . amLODipine (NORVASC) 5 MG tablet Take 1 tablet (5 mg total) by mouth daily. 90 tablet 3  . benzonatate (TESSALON) 100 MG capsule Take 1 capsule (100 mg total) by mouth every 8 (eight) hours. 21 capsule 1  . cephALEXin (KEFLEX) 500 MG capsule Take 1 capsule (500 mg total) by mouth 2 (two) times daily. 10 capsule 0  . chlorthalidone (HYGROTON) 25 MG tablet Take 1 tablet (25 mg total) by mouth daily. PATIENT NEEDS OFFICE VISIT FOR ADDITIONAL REFILLS 30 tablet 0  . ciprofloxacin (CIPRO) 250 MG tablet Take 1 tablet (250  mg total) by mouth 2 (two) times daily. 6 tablet 0  . fluconazole (DIFLUCAN) 150 MG tablet Take 1 tablet (150 mg total) by mouth once. 1 tablet 0  . levofloxacin (LEVAQUIN) 500 MG tablet Take 1 tablet (500 mg total) by mouth daily. 10 tablet 0  . ondansetron (ZOFRAN ODT) 4 MG disintegrating tablet Take 1 tablet (4 mg total) by mouth every 8 (eight) hours as needed for nausea or vomiting. 15 tablet 0  . sertraline (ZOLOFT) 50 MG tablet TAKE 1 TABLET BY MOUTH ONCE DAILY.  "OV NEEDED FOR REFILLS" 30 tablet 0   No facility-administered medications prior to visit.      ROS Review of Systems  Constitutional: Negative for chills, fever and malaise/fatigue.  Eyes: Negative for blurred vision.  Respiratory: Negative for shortness of breath.   Cardiovascular: Negative for chest pain and palpitations.  Gastrointestinal: Negative for abdominal pain and nausea.  Genitourinary: Negative for dysuria and hematuria.  Musculoskeletal: Positive for joint pain. Negative for myalgias.  Skin: Negative for rash.  Neurological: Negative for tingling and headaches.  Psychiatric/Behavioral: Negative for depression. The patient is nervous/anxious.     Objective:  Ht 5' 6.5" (1.689 m)   Wt 209 lb 6.4 oz (95 kg)   BMI 33.29 kg/m   Vitals:   05/23/17 1359  BP: (!) 149/79  Pulse: 85  Temp: 97.6 F (36.4 C)  SpO2: 98%     Physical Exam  Constitutional: She  is oriented to person, place, and time.  Well developed, well nourished, NAD, polite  HENT:  Head: Normocephalic and atraumatic.  Eyes: Conjunctivae are normal. No scleral icterus.  Neck: Normal range of motion. Neck supple. No thyromegaly present.  Cardiovascular: Normal rate, regular rhythm and normal heart sounds.  1/6 systolic murmur. No LE edema bilaterally  Pulmonary/Chest: Effort normal and breath sounds normal. No respiratory distress. She has no wheezes.  Abdominal: Soft. Bowel sounds are normal. There is no tenderness.   Musculoskeletal: She exhibits no edema.  Lymphadenopathy:    She has no cervical adenopathy.  Neurological: She is alert and oriented to person, place, and time. No cranial nerve deficit. Coordination normal.  Skin: Skin is warm and dry. No rash noted. No erythema. No pallor.  Psychiatric: Her behavior is normal. Thought content normal.  Anxious, unsettled  Vitals reviewed.    Assessment & Plan:    1. Hypertension, unspecified type - amLODipine (NORVASC) 10 MG tablet; Take 1 tablet (10 mg total) by mouth daily.  Dispense: 90 tablet; Refill: 1 - Comprehensive metabolic panel - CBC with Differential - TSH  2. Elevated lipoprotein(a) - Lipid panel - Refill atorvastatin according to lab result.  3. GAD (generalized anxiety disorder) - escitalopram (LEXAPRO) 10 MG tablet; Take 1 tablet (10 mg total) by mouth daily.  Dispense: 30 tablet; Refill: 2 - clonazePAM (KLONOPIN) 1 MG tablet; Take 1 tablet (1 mg total) by mouth at bedtime.  Dispense: 30 tablet; Refill: 0  4. Chronic pain of left knee - naproxen sodium (ALEVE) 220 MG tablet; Take 1 tablet (220 mg total) by mouth 2 (two) times daily as needed.  Dispense: 60 tablet; Refill: 0 - DG Knee Complete 4 Views Left; Future  5. Screening for diabetes mellitus - HgB A1c 5.5%   Meds ordered this encounter  Medications  . amLODipine (NORVASC) 10 MG tablet    Sig: Take 1 tablet (10 mg total) by mouth daily.    Dispense:  90 tablet    Refill:  1    Order Specific Question:   Supervising Provider    Answer:   Quentin AngstJEGEDE, OLUGBEMIGA E [id number]  . naproxen sodium (ALEVE) 220 MG tablet    Sig: Take 1 tablet (220 mg total) by mouth 2 (two) times daily as needed.    Dispense:  60 tablet    Refill:  0    Order Specific Question:   Supervising Provider    Answer:   Quentin AngstJEGEDE, OLUGBEMIGA E [id number]  . escitalopram (LEXAPRO) 10 MG tablet    Sig: Take 1 tablet (10 mg total) by mouth daily.    Dispense:  30 tablet    Refill:  2    Order  Specific Question:   Supervising Provider    Answer:   Quentin AngstJEGEDE, OLUGBEMIGA E [id number]  . clonazePAM (KLONOPIN) 1 MG tablet    Sig: Take 1 tablet (1 mg total) by mouth at bedtime.    Dispense:  30 tablet    Refill:  0    Order Specific Question:   Supervising Provider    Answer:   Quentin AngstJEGEDE, OLUGBEMIGA E [[id number]]    Follow-up: Return in about 1 month (around 06/20/2017) for Anxiety.   Loletta Specteroger David Aldeen Riga PA

## 2017-05-23 NOTE — Patient Instructions (Signed)

## 2017-05-24 ENCOUNTER — Other Ambulatory Visit (INDEPENDENT_AMBULATORY_CARE_PROVIDER_SITE_OTHER): Payer: Self-pay | Admitting: Physician Assistant

## 2017-05-24 LAB — CBC WITH DIFFERENTIAL/PLATELET
BASOS ABS: 0 10*3/uL (ref 0.0–0.2)
Basos: 0 %
EOS (ABSOLUTE): 0 10*3/uL (ref 0.0–0.4)
Eos: 1 %
HEMATOCRIT: 41.4 % (ref 34.0–46.6)
Hemoglobin: 13.9 g/dL (ref 11.1–15.9)
Immature Grans (Abs): 0 10*3/uL (ref 0.0–0.1)
Immature Granulocytes: 0 %
LYMPHS ABS: 2 10*3/uL (ref 0.7–3.1)
Lymphs: 46 %
MCH: 30.2 pg (ref 26.6–33.0)
MCHC: 33.6 g/dL (ref 31.5–35.7)
MCV: 90 fL (ref 79–97)
MONOS ABS: 0.4 10*3/uL (ref 0.1–0.9)
Monocytes: 9 %
NEUTROS ABS: 2 10*3/uL (ref 1.4–7.0)
Neutrophils: 44 %
Platelets: 286 10*3/uL (ref 150–379)
RBC: 4.6 x10E6/uL (ref 3.77–5.28)
RDW: 15.6 % — AB (ref 12.3–15.4)
WBC: 4.5 10*3/uL (ref 3.4–10.8)

## 2017-05-24 LAB — COMPREHENSIVE METABOLIC PANEL
A/G RATIO: 1.2 (ref 1.2–2.2)
ALBUMIN: 4.4 g/dL (ref 3.5–4.7)
ALK PHOS: 139 IU/L — AB (ref 39–117)
ALT: 8 IU/L (ref 0–32)
AST: 18 IU/L (ref 0–40)
BILIRUBIN TOTAL: 0.4 mg/dL (ref 0.0–1.2)
BUN / CREAT RATIO: 10 — AB (ref 12–28)
BUN: 15 mg/dL (ref 8–27)
CO2: 23 mmol/L (ref 20–29)
Calcium: 10.1 mg/dL (ref 8.7–10.3)
Chloride: 99 mmol/L (ref 96–106)
Creatinine, Ser: 1.56 mg/dL — ABNORMAL HIGH (ref 0.57–1.00)
GFR calc Af Amer: 36 mL/min/{1.73_m2} — ABNORMAL LOW (ref >59)
GFR calc non Af Amer: 31 mL/min/{1.73_m2} — ABNORMAL LOW (ref >59)
GLOBULIN, TOTAL: 3.7 g/dL (ref 1.5–4.5)
GLUCOSE: 87 mg/dL (ref 65–99)
Potassium: 4.7 mmol/L (ref 3.5–5.2)
Sodium: 140 mmol/L (ref 134–144)
Total Protein: 8.1 g/dL (ref 6.0–8.5)

## 2017-05-24 LAB — TSH: TSH: 1.99 u[IU]/mL (ref 0.450–4.500)

## 2017-05-24 LAB — LIPID PANEL
CHOLESTEROL TOTAL: 188 mg/dL (ref 100–199)
Chol/HDL Ratio: 3 ratio (ref 0.0–4.4)
HDL: 62 mg/dL (ref >39)
LDL CALC: 111 mg/dL — AB (ref 0–99)
Triglycerides: 77 mg/dL (ref 0–149)
VLDL Cholesterol Cal: 15 mg/dL (ref 5–40)

## 2017-05-24 MED ORDER — ATORVASTATIN CALCIUM 40 MG PO TABS: ORAL_TABLET | ORAL | 11 refills | Status: DC

## 2017-05-24 MED ORDER — EZETIMIBE 10 MG PO TABS: 10.0000 mg | ORAL_TABLET | Freq: Every day | ORAL | 5 refills | Status: DC

## 2017-05-25 ENCOUNTER — Telehealth (INDEPENDENT_AMBULATORY_CARE_PROVIDER_SITE_OTHER): Payer: Self-pay

## 2017-05-25 NOTE — Telephone Encounter (Signed)
-----  Message from Roger David Gomez, PA-C sent at 05/24/2017  7:53 PM EDT ----- Please have patient return for lab explanation and work up of abnormal labs. Decreased renal function over two years ago, elevated Alk phos, and elevated LDL. I have sent atorvastatin refill and a new drug Zetia to [facility] to help lower her cholesterol. 

## 2017-05-25 NOTE — Telephone Encounter (Signed)
Called  patient, her phone rings several times and then disconnects. Will try calling again before mailing results. Maryjean Mornempestt S Roberts, CMA

## 2017-05-28 ENCOUNTER — Telehealth (INDEPENDENT_AMBULATORY_CARE_PROVIDER_SITE_OTHER): Payer: Self-pay

## 2017-05-28 NOTE — Telephone Encounter (Signed)
-----  Message from Clent Demark, PA-C sent at 05/24/2017  7:53 PM EDT ----- Please have patient return for lab explanation and work up of abnormal labs. Decreased renal function over two years ago, elevated Alk phos, and elevated LDL. I have sent atorvastatin refill and a new drug Zetia to [facility] to help lower her cholesterol.

## 2017-05-28 NOTE — Telephone Encounter (Signed)
Left patient a message, asking her to call [facility]. Maryjean Mornempestt S Roberts, CMA

## 2017-05-29 ENCOUNTER — Telehealth (INDEPENDENT_AMBULATORY_CARE_PROVIDER_SITE_OTHER): Payer: Self-pay

## 2017-05-29 NOTE — Telephone Encounter (Signed)
Patient is aware of the need to return for explanation of labs and additional blood work, scheduled for 05/30/17 at 1:50. Patient aware of decreased renal function over two years ago, elevated alk phos, elevated LDL. Patient has picked up prescriptions. Nat Christen, CMA

## 2017-05-29 NOTE — Telephone Encounter (Signed)
-----  Message from Roger David Gomez, PA-C sent at 05/24/2017  7:53 PM EDT ----- Please have patient return for lab explanation and work up of abnormal labs. Decreased renal function over two years ago, elevated Alk phos, and elevated LDL. I have sent atorvastatin refill and a new drug Zetia to [facility] to help lower her cholesterol. 

## 2017-05-30 ENCOUNTER — Ambulatory Visit (INDEPENDENT_AMBULATORY_CARE_PROVIDER_SITE_OTHER): Payer: Medicare HMO | Admitting: Physician Assistant

## 2017-05-30 ENCOUNTER — Other Ambulatory Visit: Payer: Self-pay

## 2017-05-30 ENCOUNTER — Encounter (INDEPENDENT_AMBULATORY_CARE_PROVIDER_SITE_OTHER): Payer: Self-pay | Admitting: Physician Assistant

## 2017-05-30 VITALS — BP 153/79 | HR 88 | Temp 98.0°F | Ht 66.5 in | Wt 209.2 lb

## 2017-05-30 DIAGNOSIS — R7989 Other specified abnormal findings of blood chemistry: Secondary | ICD-10-CM

## 2017-05-30 LAB — POCT URINALYSIS DIPSTICK
BILIRUBIN UA: NEGATIVE
Glucose, UA: NEGATIVE
Ketones, UA: NEGATIVE
LEUKOCYTES UA: NEGATIVE
Nitrite, UA: NEGATIVE
PH UA: 5.5 (ref 5.0–8.0)
Protein, UA: NEGATIVE
RBC UA: NEGATIVE
Spec Grav, UA: 1.01 (ref 1.010–1.025)
UROBILINOGEN UA: 0.2 U/dL

## 2017-05-30 NOTE — Progress Notes (Signed)
Subjective:  Patient ID: Leah Anderson, female    DOB: 05/06/1933  Age: 84 y.o. MRN: [id number]  CC: explanation of labs and medications.  HPI  Leah Anderson is a 84 y.o. female with a medical history of HTN, HLD, GAD, OA L knee, essential tremor, poor memory, insomnia, orthostatic hypotension, and CAP presents for explanation of lab results and explanation of medications. She has not taken any medications because she does not know what they are for. Labs have revealed elevated serum creatinine at 1.56 mg/dL in comparison to 4.09 mg/dL two years ago. Pt does not endorse difficulty urinating, hematuria, back pain, f/c/n/v, edema, HA, SOB, or CP.     Outpatient Medications Prior to Visit  Medication Sig Dispense Refill  . amLODipine (NORVASC) 10 MG tablet Take 1 tablet (10 mg total) by mouth daily. 90 tablet 1  . atorvastatin (LIPITOR) 40 MG tablet TAKE 1 TABLET BY MOUTH ONCE DAILY  "OV NEEDED FOR REFILLS" 30 tablet 11  . clonazePAM (KLONOPIN) 1 MG tablet Take 1 tablet (1 mg total) by mouth at bedtime. 30 tablet 0  . escitalopram (LEXAPRO) 10 MG tablet Take 1 tablet (10 mg total) by mouth daily. 30 tablet 2  . ezetimibe (ZETIA) 10 MG tablet Take 1 tablet (10 mg total) by mouth daily. 30 tablet 5  . naproxen sodium (ALEVE) 220 MG tablet Take 1 tablet (220 mg total) by mouth 2 (two) times daily as needed. (Patient not taking: Reported on 05/30/2017) 60 tablet 0   No facility-administered medications prior to visit.      ROS Review of Systems  Constitutional: Negative for chills, fever and malaise/fatigue.  Eyes: Negative for blurred vision.  Respiratory: Negative for shortness of breath.   Cardiovascular: Negative for chest pain and palpitations.  Gastrointestinal: Negative for abdominal pain and nausea.  Genitourinary: Negative for dysuria and hematuria.  Musculoskeletal: Negative for joint pain and myalgias.  Skin: Negative for rash.  Neurological: Negative for tingling and headaches.   Psychiatric/Behavioral: Negative for depression. The patient is nervous/anxious.     Objective:  BP (!) 153/79 (BP Location: Left Arm, Patient Position: Sitting, Cuff Size: Large)   Pulse 88   Temp 98 F (36.7 C) (Oral)   Ht 5' 6.5" (1.689 m)   Wt 209 lb 3.2 oz (94.9 kg)   SpO2 100%   BMI 33.26 kg/m   BP/Weight 05/30/2017 05/23/2017 05/09/2015  Systolic BP 153 149 115  Diastolic BP 79 79 70  Wt. (Lbs) 209.2 209.4 -  BMI 33.26 33.29 -      Physical Exam  Constitutional: She is oriented to person, place, and time.  Well developed, well nourished, NAD, polite  HENT:  Head: Normocephalic and atraumatic.  Eyes: Conjunctivae are normal. No scleral icterus.  Neck: Normal range of motion. Neck supple. No thyromegaly present.  Cardiovascular: Normal rate, regular rhythm and normal heart sounds.  Pulmonary/Chest: Effort normal and breath sounds normal.  Abdominal: Soft. Bowel sounds are normal. There is no tenderness.  Genitourinary:  Genitourinary Comments: No CVA tenderness  Musculoskeletal: She exhibits no edema.  Neurological: She is alert and oriented to person, place, and time.  Skin: Skin is warm and dry. No rash noted. No erythema. No pallor.  Psychiatric: Her behavior is normal. Thought content normal.  anxious  Vitals reviewed.    Assessment & Plan:    1. Elevated serum creatinine - Basic Metabolic Panel - ANA w/Reflex - Sedimentation Rate - C-reactive protein - Urinalysis Dipstick    Follow-up:  Return for will call.   Loletta Specteroger David Aarnav Steagall PA

## 2017-05-30 NOTE — Patient Instructions (Signed)
Serum Creatinine Test Why am I having this test? A creatinine test is performed to measure the amount of creatinine in your blood (serum). Creatinine is a waste product of normal muscle activity (contraction). The kidneys filter creatinine from your blood and remove it from your body through urination. Blood creatinine levels stay consistent in people whose muscle mass stays consistent. This level can be increased in people who perform resistance exercise to increase muscle mass. Because creatinine is removed from your body by the kidneys, this test is often used as a way to measure kidney function. Your health care provider may recommend this test if he or she suspects that you have a condition that is negatively affecting your kidney function. This test may also be done as a part of routine blood work used to assess your overall health. What kind of sample is taken? A blood sample is required for this test. It is usually collected by inserting a needle into a vein or by sticking a finger with a small needle. For children, the blood sample is usually collected by sticking the child's heel with a small needle. How do I prepare for this test? There is no preparation or fasting required for this test. What are the reference ranges? Reference ranges are considered healthy ranges established after testing a large group of healthy people. Reference ranges may vary among different people, labs, and hospitals. It is your responsibility to obtain your test results. Ask the lab or department performing the test when and how you will get your results.  Children or adolescents: ? Less than 2 years old: 0.1-0.4 mg/dL. ? 2-5 years old: 0.2-0.5 mg/dL. ? 6-9 years old: 0.3-0.6 mg/dL. ? 10-17 years old: 0.4-1 mg/dL.  Adult female: ? 18-40 years old: 0.5-1 mg/dL. ? 41-60 years old: 0.5-1.1 mg/dL. ? 61 years and older: 0.5-1.2 mg/dL.  Adult female: ? 18-40 years old: 0.6-1.2 mg/dL. ? 41-60 years old: 0.6-1.3  mg/dL. ? 61 years and older: 0.7-1.3 mg/dL.  The reference range may be higher in people who perform resistance exercise to increase muscle mass. What do the results mean? Abnormally high levels of serum creatinine can be caused by many health conditions. These may include:  Kidney disease.  Urinary tract obstruction.  Lower-than-normal blood flow to the kidneys.  Rhabdomyolysis. This occurs when muscle damage causes the release of molecules into the bloodstream, which results in kidney damage.  Acromegaly. This is a condition that causes enlarged bones.  Gigantism.  Abnormally low levels of serum creatinine can also be caused by many health conditions. These may include:  Conditions that cause you to be inactive throughout much of the day.  Decreased muscle mass.  Talk with your health care provider to discuss your results, treatment options, and if necessary, the need for more tests. Talk with your health care provider if you have any questions about your results. Talk with your health care provider to discuss your results, treatment options, and if necessary, the need for more tests. Talk with your health care provider if you have any questions about your results. This information is not intended to replace advice given to you by your health care provider. Make sure you discuss any questions you have with your health care provider. Document Released: 03/08/2004 Document Revised: 10/09/2015 Document Reviewed: 07/10/2013 Elsevier Interactive Patient Education  2018 Elsevier Inc.  

## 2017-05-31 LAB — BASIC METABOLIC PANEL
BUN/Creatinine Ratio: 10 — ABNORMAL LOW (ref 12–28)
BUN: 13 mg/dL (ref 8–27)
CHLORIDE: 99 mmol/L (ref 96–106)
CO2: 22 mmol/L (ref 20–29)
Calcium: 10 mg/dL (ref 8.7–10.3)
Creatinine, Ser: 1.32 mg/dL — ABNORMAL HIGH (ref 0.57–1.00)
GFR calc non Af Amer: 38 mL/min/{1.73_m2} — ABNORMAL LOW (ref >59)
GFR, EST AFRICAN AMERICAN: 44 mL/min/{1.73_m2} — AB (ref >59)
Glucose: 87 mg/dL (ref 65–99)
Potassium: 4.3 mmol/L (ref 3.5–5.2)
Sodium: 138 mmol/L (ref 134–144)

## 2017-05-31 LAB — SEDIMENTATION RATE: Sed Rate: 91 mm/hr — ABNORMAL HIGH (ref 0–40)

## 2017-05-31 LAB — ANA W/REFLEX: Anti Nuclear Antibody(ANA): NEGATIVE

## 2017-05-31 LAB — C-REACTIVE PROTEIN: CRP: 2.9 mg/L (ref 0.0–4.9)

## 2017-06-01 ENCOUNTER — Telehealth (INDEPENDENT_AMBULATORY_CARE_PROVIDER_SITE_OTHER): Payer: Self-pay

## 2017-06-01 NOTE — Telephone Encounter (Signed)
-----   Message from Loletta Specteroger David Gomez, PA-C sent at 06/01/2017  1:15 PM EDT ----- Serum creatinine (kidney filtration) is mildly better than previous reading. Signs of chronic inflammation but no autoimmune disease. Don't know where inflammation is from but she has complained of knee pain before.

## 2017-06-01 NOTE — Telephone Encounter (Signed)
Patients daughter is aware that patients kidney filtration is better than before. Signs of chronic inflammation no autoimmune disease, unsure where inflammation is from but patient has complained of knee pain before. Maryjean Mornempestt S Kyre Jeffries, CMA

## 2017-06-14 ENCOUNTER — Telehealth (INDEPENDENT_AMBULATORY_CARE_PROVIDER_SITE_OTHER): Payer: Self-pay | Admitting: Physician Assistant

## 2017-06-14 NOTE — Telephone Encounter (Signed)
Pt called to request a refill for naproxen sodium (ALEVE) 220 MG tablet  [id number] - [place],  - [address] Please follow up

## 2017-06-14 NOTE — Telephone Encounter (Signed)
Patient aware that medication is not covered by her insurance as it is over the counter. Leah Anderson, CMA

## 2017-06-20 ENCOUNTER — Ambulatory Visit (INDEPENDENT_AMBULATORY_CARE_PROVIDER_SITE_OTHER): Payer: Medicare HMO | Admitting: Physician Assistant

## 2017-06-20 ENCOUNTER — Other Ambulatory Visit: Payer: Self-pay

## 2017-06-20 ENCOUNTER — Encounter (INDEPENDENT_AMBULATORY_CARE_PROVIDER_SITE_OTHER): Payer: Self-pay | Admitting: Physician Assistant

## 2017-06-20 VITALS — BP 148/74 | HR 77 | Temp 97.5°F | Ht 66.5 in | Wt 206.8 lb

## 2017-06-20 DIAGNOSIS — F411 Generalized anxiety disorder: Secondary | ICD-10-CM

## 2017-06-20 DIAGNOSIS — Z23 Encounter for immunization: Secondary | ICD-10-CM | POA: Diagnosis not present

## 2017-06-20 DIAGNOSIS — E2839 Other primary ovarian failure: Secondary | ICD-10-CM

## 2017-06-20 DIAGNOSIS — I10 Essential (primary) hypertension: Secondary | ICD-10-CM | POA: Diagnosis not present

## 2017-06-20 DIAGNOSIS — R7989 Other specified abnormal findings of blood chemistry: Secondary | ICD-10-CM | POA: Diagnosis not present

## 2017-06-20 DIAGNOSIS — R69 Illness, unspecified: Secondary | ICD-10-CM | POA: Diagnosis not present

## 2017-06-20 MED ORDER — CLONAZEPAM 0.5 MG PO TABS: 0.5000 mg | ORAL_TABLET | Freq: Every day | ORAL | 0 refills | Status: DC

## 2017-06-20 MED ORDER — LOSARTAN POTASSIUM 50 MG PO TABS: 50.0000 mg | ORAL_TABLET | Freq: Every day | ORAL | 3 refills | Status: DC

## 2017-06-20 MED ORDER — ESCITALOPRAM OXALATE 10 MG PO TABS: 10.0000 mg | ORAL_TABLET | Freq: Every day | ORAL | 5 refills | Status: DC

## 2017-06-20 NOTE — Progress Notes (Signed)
Subjective:  Patient ID: Leah Anderson, female    DOB: 12/25/1933  Age: 84 y.o. MRN: [id number]  CC: HTN f/u  HPI  Leah Anderson a 84 y.o.femalewith a medical history of HTN, HLD, GAD, OA L knee, essential tremor, poor memory, insomnia, orthostatic hypotension, and CAP presents to f/u on HTN. Seen here three weeks ago for f/u of HTN but she had not been taking any of her medications because she did not know what they were for. All medications fully explained at the last visit. Pt states she is now taking all her medications, including Lexapro and Clonazepam. Says she is feeling better than ever before. No longer nervous/anxious. Does not endorse CP, palpitations, SOB, HA, tingling, numbness, weakness, abdominal pain, f/c/n/v, rash, swelling, or GI/GU sxs.      Outpatient Medications Prior to Visit  Medication Sig Dispense Refill  . amLODipine (NORVASC) 10 MG tablet Take 1 tablet (10 mg total) by mouth daily. 90 tablet 1  . atorvastatin (LIPITOR) 40 MG tablet TAKE 1 TABLET BY MOUTH ONCE DAILY  "OV NEEDED FOR REFILLS" 30 tablet 11  . clonazePAM (KLONOPIN) 1 MG tablet Take 1 tablet (1 mg total) by mouth at bedtime. 30 tablet 0  . escitalopram (LEXAPRO) 10 MG tablet Take 1 tablet (10 mg total) by mouth daily. 30 tablet 2  . ezetimibe (ZETIA) 10 MG tablet Take 1 tablet (10 mg total) by mouth daily. 30 tablet 5  . naproxen sodium (ALEVE) 220 MG tablet Take 1 tablet (220 mg total) by mouth 2 (two) times daily as needed. 60 tablet 0   No facility-administered medications prior to visit.      ROS Review of Systems  Constitutional: Negative for chills, fever and malaise/fatigue.  Eyes: Negative for blurred vision.  Respiratory: Negative for shortness of breath.   Cardiovascular: Negative for chest pain and palpitations.  Gastrointestinal: Negative for abdominal pain and nausea.  Genitourinary: Negative for dysuria and hematuria.  Musculoskeletal: Negative for joint pain and myalgias.   Skin: Negative for rash.  Neurological: Negative for tingling and headaches.  Psychiatric/Behavioral: Negative for depression. The patient is not nervous/anxious.     Objective:  Ht 5' 6.5" (1.689 m)   Wt 206 lb 12.8 oz (93.8 kg)   BMI 32.88 kg/m   Vitals:   06/20/17 1343  BP: (!) 148/74  Pulse: 77  Temp: (!) 97.5 F (36.4 C)  SpO2: 97%     Physical Exam  Constitutional: She is oriented to person, place, and time.  Well developed, well nourished, NAD, polite  HENT:  Head: Normocephalic and atraumatic.  Eyes: No scleral icterus.  Neck: Normal range of motion. Neck supple. No thyromegaly present.  Cardiovascular: Normal rate, regular rhythm and normal heart sounds.  Pulmonary/Chest: Effort normal and breath sounds normal.  Abdominal: Soft. Bowel sounds are normal. There is no tenderness.  Musculoskeletal: She exhibits no edema.  Neurological: She is alert and oriented to person, place, and time.  Skin: Skin is warm and dry. No rash noted. No erythema. No pallor.  Psychiatric: She has a normal mood and affect. Her behavior is normal. Thought content normal.  Vitals reviewed.    Assessment & Plan:    1. Elevated serum creatinine - Basic Metabolic Panel  2. Hypertension, unspecified type - Basic Metabolic Panel - losartan (COZAAR) 50 MG tablet; Take 1 tablet (50 mg total) by mouth daily.  Dispense: 90 tablet; Refill: 3  3. Need for pneumococcal vaccination - Pneumococcal conjugate vaccine 13-valent IM  4. Need for Tdap vaccination - Tdap vaccine greater than or equal to 7yo IM; Future  5. Estrogen deficiency - DG Bone Density; Future  6. GAD (generalized anxiety disorder) - clonazePAM (KLONOPIN) 0.5 MG tablet; Take 1 tablet (0.5 mg total) by mouth at bedtime.  Dispense: 30 tablet; Refill: 0 - escitalopram (LEXAPRO) 10 MG tablet; Take 1 tablet (10 mg total) by mouth daily.  Dispense: 30 tablet; Refill: 5   Meds ordered this encounter  Medications  .  clonazePAM (KLONOPIN) 0.5 MG tablet    Sig: Take 1 tablet (0.5 mg total) by mouth at bedtime.    Dispense:  30 tablet    Refill:  0    Order Specific Question:   Supervising Provider    Answer:   Quentin Angst [id number]  . escitalopram (LEXAPRO) 10 MG tablet    Sig: Take 1 tablet (10 mg total) by mouth daily.    Dispense:  30 tablet    Refill:  5    Order Specific Question:   Supervising Provider    Answer:   Quentin Angst [id number]  . losartan (COZAAR) 50 MG tablet    Sig: Take 1 tablet (50 mg total) by mouth daily.    Dispense:  90 tablet    Refill:  3    Order Specific Question:   Supervising Provider    Answer:   Quentin Angst [[id number]]    Follow-up: 8 weeks for HTN  Loletta Specter PA

## 2017-06-20 NOTE — Patient Instructions (Signed)
Td Vaccine (Tetanus and Diphtheria): What You Need to Know 1. Why get vaccinated? Tetanus  and diphtheria are very serious diseases. They are rare in the [place] today, but people who do become infected often have severe complications. Td vaccine is used to protect adolescents and adults from both of these diseases. Both tetanus and diphtheria are infections caused by bacteria. Diphtheria spreads from person to person through coughing or sneezing. Tetanus-causing bacteria enter the body through cuts, scratches, or wounds. TETANUS (lockjaw) causes painful muscle tightening and stiffness, usually all over the body.  It can lead to tightening of muscles in the head and neck so you can't open your mouth, swallow, or sometimes even breathe. Tetanus kills about 1 out of every 10 people who are infected even after receiving the best medical care.  DIPHTHERIA can cause a thick coating to form in the back of the throat.  It can lead to breathing problems, paralysis, heart failure, and death.  Before vaccines, as many as 200,000 cases of diphtheria and hundreds of cases of tetanus were reported in the [place] each year. Since vaccination began, reports of cases for both diseases have dropped by about 99%. 2. Td vaccine Td vaccine can protect adolescents and adults from tetanus and diphtheria. Td is usually given as a booster dose every 10 years but it can also be given earlier after a severe and dirty wound or burn. Another vaccine, called Tdap, which protects against pertussis in addition to tetanus and diphtheria, is sometimes recommended instead of Td vaccine. Your doctor or the person giving you the vaccine can give you more information. Td may safely be given at the same time as other vaccines. 3. Some people should not get this vaccine  A person who has ever had a life-threatening allergic reaction after a previous dose of any tetanus or diphtheria containing vaccine, OR has a severe  allergy to any part of this vaccine, should not get Td vaccine. Tell the person giving the vaccine about any severe allergies.  Talk to your doctor if you: ? had severe pain or swelling after any vaccine containing diphtheria or tetanus, ? ever had a condition called Guillain Barre Syndrome (GBS), ? aren't feeling well on the day the shot is scheduled. 4. What are the risks from Td vaccine? With any medicine, including vaccines, there is a chance of side effects. These are usually mild and go away on their own. Serious reactions are also possible but are rare. Most people who get Td vaccine do not have any problems with it. Mild problems following Td vaccine: (Did not interfere with activities)  Pain where the shot was given (about 8 people in 10)  Redness or swelling where the shot was given (about 1 person in 4)  Mild fever (rare)  Headache (about 1 person in 4)  Tiredness (about 1 person in 4)  Moderate problems following Td vaccine: (Interfered with activities, but did not require medical attention)  Fever over 102F (rare)  Severe problems following Td vaccine: (Unable to perform usual activities; required medical attention)  Swelling, severe pain, bleeding and/or redness in the arm where the shot was given (rare).  Problems that could happen after any vaccine:  People sometimes faint after a medical procedure, including vaccination. Sitting or lying down for about 15 minutes can help prevent fainting, and injuries caused by a fall. Tell your doctor if you feel dizzy, or have vision changes or ringing in the ears.  Some people get   severe pain in the shoulder and have difficulty moving the arm where a shot was given. This happens very rarely.  Any medication can cause a severe allergic reaction. Such reactions from a vaccine are very rare, estimated at fewer than 1 in a million doses, and would happen within a few minutes to a few hours after the vaccination. As with any  medicine, there is a very remote chance of a vaccine causing a serious injury or death. The safety of vaccines is always being monitored. For more information, visit: www.cdc.gov/vaccinesafety/ 5. What if there is a serious reaction? What should I look for? Look for anything that concerns you, such as signs of a severe allergic reaction, very high fever, or unusual behavior. Signs of a severe allergic reaction can include hives, swelling of the face and throat, difficulty breathing, a fast heartbeat, dizziness, and weakness. These would usually start a few minutes to a few hours after the vaccination. What should I do?  If you think it is a severe allergic reaction or other emergency that can't wait, call 9-1-1 or get the person to the nearest hospital. Otherwise, call your doctor.  Afterward, the reaction should be reported to the Vaccine Adverse Event Reporting System (VAERS). Your doctor might file this report, or you can do it yourself through the VAERS web site at www.vaers.hhs.gov, or by calling 1-800-822-7967. ? VAERS does not give medical advice. 6. The National Vaccine Injury Compensation Program The National Vaccine Injury Compensation Program (VICP) is a federal program that was created to compensate people who may have been injured by certain vaccines. Persons who believe they may have been injured by a vaccine can learn about the program and about filing a claim by calling 1-800-338-2382 or visiting the VICP website at www.hrsa.gov/vaccinecompensation. There is a time limit to file a claim for compensation. 7. How can I learn more?  Ask your doctor. He or she can give you the vaccine package insert or suggest other sources of information.  Call your local or state health department.  Contact the Centers for Disease Control and Prevention (CDC): ? Call 1-800-232-4636 (1-800-CDC-INFO) ? Visit CDC's website at www.cdc.gov/vaccines CDC Td Vaccine VIS (06/08/15) This information is  not intended to replace advice given to you by your health care provider. Make sure you discuss any questions you have with your health care provider. Document Released: 12/11/2005 Document Revised: 11/04/2015 Document Reviewed: 11/04/2015 Elsevier Interactive Patient Education  2017 Elsevier Inc. Pneumococcal Conjugate Vaccine suspension for injection What is this medicine? PNEUMOCOCCAL VACCINE (NEU mo KOK al vak SEEN) is a vaccine used to prevent pneumococcus bacterial infections. These bacteria can cause serious infections like pneumonia, meningitis, and blood infections. This vaccine will lower your chance of getting pneumonia. If you do get pneumonia, it can make your symptoms milder and your illness shorter. This vaccine will not treat an infection and will not cause infection. This vaccine is recommended for infants and young children, adults with certain medical conditions, and adults 65 years or older. This medicine may be used for other purposes; ask your health care provider or pharmacist if you have questions. COMMON BRAND NAME(S): Prevnar, Prevnar 13 What should I tell my health care provider before I take this medicine? They need to know if you have any of these conditions: -bleeding problems -fever -immune system problems -an unusual or allergic reaction to pneumococcal vaccine, diphtheria toxoid, other vaccines, latex, other medicines, foods, dyes, or preservatives -pregnant or trying to get pregnant -breast-feeding How should   I use this medicine? This vaccine is for injection into a muscle. It is given by a health care professional. A copy of Vaccine Information Statements will be given before each vaccination. Read this sheet carefully each time. The sheet may change frequently. Talk to your pediatrician regarding the use of this medicine in children. While this drug may be prescribed for children as young as 6 weeks old for selected conditions, precautions do  apply. Overdosage: If you think you have taken too much of this medicine contact a poison control center or emergency room at once. NOTE: This medicine is only for you. Do not share this medicine with others. What if I miss a dose? It is important not to miss your dose. Call your doctor or health care professional if you are unable to keep an appointment. What may interact with this medicine? -medicines for cancer chemotherapy -medicines that suppress your immune function -steroid medicines like prednisone or cortisone This list may not describe all possible interactions. Give your health care provider a list of all the medicines, herbs, non-prescription drugs, or dietary supplements you use. Also tell them if you smoke, drink alcohol, or use illegal drugs. Some items may interact with your medicine. What should I watch for while using this medicine? Mild fever and pain should go away in 3 days or less. Report any unusual symptoms to your doctor or health care professional. What side effects may I notice from receiving this medicine? Side effects that you should report to your doctor or health care professional as soon as possible: -allergic reactions like skin rash, itching or hives, swelling of the face, lips, or tongue -breathing problems -confused -fast or irregular heartbeat -fever over 102 degrees F -seizures -unusual bleeding or bruising -unusual muscle weakness Side effects that usually do not require medical attention (report to your doctor or health care professional if they continue or are bothersome): -aches and pains -diarrhea -fever of 102 degrees F or less -headache -irritable -loss of appetite -pain, tender at site where injected -trouble sleeping This list may not describe all possible side effects. Call your doctor for medical advice about side effects. You may report side effects to FDA at 1-800-FDA-1088. Where should I keep my medicine? This does not apply. This  vaccine is given in a clinic, pharmacy, doctor's office, or other health care setting and will not be stored at home. NOTE: This sheet is a summary. It may not cover all possible information. If you have questions about this medicine, talk to your doctor, pharmacist, or health care provider.  2018 Elsevier/Gold Standard (2013-11-20 10:27:27)  

## 2017-06-21 LAB — BASIC METABOLIC PANEL
BUN/Creatinine Ratio: 13 (ref 12–28)
BUN: 16 mg/dL (ref 8–27)
CALCIUM: 9.8 mg/dL (ref 8.7–10.3)
CO2: 22 mmol/L (ref 20–29)
Chloride: 102 mmol/L (ref 96–106)
Creatinine, Ser: 1.19 mg/dL — ABNORMAL HIGH (ref 0.57–1.00)
GFR, EST AFRICAN AMERICAN: 49 mL/min/{1.73_m2} — AB (ref >59)
GFR, EST NON AFRICAN AMERICAN: 43 mL/min/{1.73_m2} — AB (ref >59)
Glucose: 89 mg/dL (ref 65–99)
POTASSIUM: 4.6 mmol/L (ref 3.5–5.2)
Sodium: 140 mmol/L (ref 134–144)

## 2017-06-22 ENCOUNTER — Telehealth (INDEPENDENT_AMBULATORY_CARE_PROVIDER_SITE_OTHER): Payer: Self-pay

## 2017-06-22 NOTE — Telephone Encounter (Signed)
Patient aware. Vernella Niznik S Bernell Sigal, CMA  

## 2017-06-22 NOTE — Telephone Encounter (Signed)
-----   Message from Loletta Specteroger David Gomez, PA-C sent at 06/21/2017  1:43 PM EDT ----- Serum creatinine is better than previous. We will continue to monitor at future visits.

## 2017-06-28 ENCOUNTER — Other Ambulatory Visit (INDEPENDENT_AMBULATORY_CARE_PROVIDER_SITE_OTHER): Payer: Self-pay | Admitting: Physician Assistant

## 2017-06-29 ENCOUNTER — Telehealth (INDEPENDENT_AMBULATORY_CARE_PROVIDER_SITE_OTHER): Payer: Self-pay | Admitting: Physician Assistant

## 2017-06-29 NOTE — Telephone Encounter (Signed)
Patient has been informed that she needs to take both medications. Maryjean Morn, CMA

## 2017-06-29 NOTE — Telephone Encounter (Signed)
Pt called to request clarification on which Bp medication she should be taking, or if she should be on both -losartan (COZAAR) 50 MG tablet  -amLODipine (NORVASC) 10 MG tablet  Please follow up

## 2017-07-27 ENCOUNTER — Other Ambulatory Visit (INDEPENDENT_AMBULATORY_CARE_PROVIDER_SITE_OTHER): Payer: Self-pay | Admitting: Physician Assistant

## 2017-07-27 DIAGNOSIS — F411 Generalized anxiety disorder: Secondary | ICD-10-CM

## 2017-07-30 ENCOUNTER — Telehealth (INDEPENDENT_AMBULATORY_CARE_PROVIDER_SITE_OTHER): Payer: Self-pay | Admitting: Physician Assistant

## 2017-07-30 NOTE — Telephone Encounter (Signed)
Patient requesting a refill  Of  clonazePAM (KLONOPIN) 0.5 MG tablet  [facility] [address]  Thank You

## 2017-07-30 NOTE — Telephone Encounter (Signed)
Refill request has been denied previously and patient aware that she will need to wait until her OV on 6/19 for possible refills. Leah Anderson S Leah Anderson, CMA

## 2017-08-01 NOTE — Telephone Encounter (Signed)
Denied by [employer] Provider, routed to returning PCP. Please advise patient.

## 2017-08-01 NOTE — Telephone Encounter (Signed)
Pt to return to office for further anxiety treatment.

## 2017-08-08 ENCOUNTER — Inpatient Hospital Stay: Admission: RE | Admit: 2017-08-08 | Payer: Medicare HMO | Source: Ambulatory Visit

## 2017-08-10 ENCOUNTER — Emergency Department (HOSPITAL_COMMUNITY): Payer: Medicare HMO

## 2017-08-10 ENCOUNTER — Other Ambulatory Visit: Payer: Self-pay

## 2017-08-10 ENCOUNTER — Encounter (HOSPITAL_COMMUNITY): Payer: Self-pay

## 2017-08-10 ENCOUNTER — Emergency Department (HOSPITAL_COMMUNITY)
Admission: EM | Admit: 2017-08-10 | Discharge: 2017-08-10 | Disposition: A | Discharge status: Home / Self Care | Payer: Medicare HMO | Attending: Emergency Medicine | Admitting: Emergency Medicine

## 2017-08-10 DIAGNOSIS — K1379 Other lesions of oral mucosa: Secondary | ICD-10-CM

## 2017-08-10 DIAGNOSIS — R69 Illness, unspecified: Secondary | ICD-10-CM | POA: Diagnosis not present

## 2017-08-10 DIAGNOSIS — I10 Essential (primary) hypertension: Secondary | ICD-10-CM | POA: Insufficient documentation

## 2017-08-10 DIAGNOSIS — K068 Other specified disorders of gingiva and edentulous alveolar ridge: Secondary | ICD-10-CM | POA: Diagnosis present

## 2017-08-10 DIAGNOSIS — I491 Atrial premature depolarization: Secondary | ICD-10-CM

## 2017-08-10 DIAGNOSIS — F411 Generalized anxiety disorder: Secondary | ICD-10-CM

## 2017-08-10 DIAGNOSIS — Z9049 Acquired absence of other specified parts of digestive tract: Secondary | ICD-10-CM | POA: Diagnosis not present

## 2017-08-10 DIAGNOSIS — Z79899 Other long term (current) drug therapy: Secondary | ICD-10-CM | POA: Diagnosis not present

## 2017-08-10 DIAGNOSIS — R041 Hemorrhage from throat: Secondary | ICD-10-CM | POA: Diagnosis not present

## 2017-08-10 DIAGNOSIS — Z87891 Personal history of nicotine dependence: Secondary | ICD-10-CM | POA: Insufficient documentation

## 2017-08-10 DIAGNOSIS — R0600 Dyspnea, unspecified: Secondary | ICD-10-CM | POA: Diagnosis not present

## 2017-08-10 LAB — URINALYSIS, ROUTINE W REFLEX MICROSCOPIC
Bacteria, UA: NONE SEEN
Bilirubin Urine: NEGATIVE
GLUCOSE, UA: NEGATIVE mg/dL
Hgb urine dipstick: NEGATIVE
KETONES UR: NEGATIVE mg/dL
NITRITE: NEGATIVE
PH: 5 (ref 5.0–8.0)
Protein, ur: NEGATIVE mg/dL
SPECIFIC GRAVITY, URINE: 1.016 (ref 1.005–1.030)

## 2017-08-10 LAB — COMPREHENSIVE METABOLIC PANEL
ALT: 10 U/L — AB (ref 14–54)
AST: 15 U/L (ref 15–41)
Albumin: 4.3 g/dL (ref 3.5–5.0)
Alkaline Phosphatase: 109 U/L (ref 38–126)
Anion gap: 11 (ref 5–15)
BUN: 22 mg/dL — ABNORMAL HIGH (ref 6–20)
CHLORIDE: 100 mmol/L — AB (ref 101–111)
CO2: 27 mmol/L (ref 22–32)
Calcium: 10 mg/dL (ref 8.9–10.3)
Creatinine, Ser: 1.31 mg/dL — ABNORMAL HIGH (ref 0.44–1.00)
GFR calc non Af Amer: 37 mL/min — ABNORMAL LOW (ref >60)
GFR, EST AFRICAN AMERICAN: 43 mL/min — AB (ref >60)
Glucose, Bld: 104 mg/dL — ABNORMAL HIGH (ref 65–99)
POTASSIUM: 4.1 mmol/L (ref 3.5–5.1)
SODIUM: 138 mmol/L (ref 135–145)
Total Bilirubin: 0.5 mg/dL (ref 0.3–1.2)
Total Protein: 8.7 g/dL — ABNORMAL HIGH (ref 6.5–8.1)

## 2017-08-10 LAB — CBC
HCT: 41.3 % (ref 36.0–46.0)
HEMOGLOBIN: 13.6 g/dL (ref 12.0–15.0)
MCH: 29.6 pg (ref 26.0–34.0)
MCHC: 32.9 g/dL (ref 30.0–36.0)
MCV: 90 fL (ref 78.0–100.0)
Platelets: 301 10*3/uL (ref 150–400)
RBC: 4.59 MIL/uL (ref 3.87–5.11)
RDW: 14.5 % (ref 11.5–15.5)
WBC: 6.1 10*3/uL (ref 4.0–10.5)

## 2017-08-10 LAB — PROTIME-INR
INR: 1
PROTHROMBIN TIME: 13.1 s (ref 11.4–15.2)

## 2017-08-10 LAB — I-STAT TROPONIN, ED: TROPONIN I, POC: 0 ng/mL (ref 0.00–0.08)

## 2017-08-10 LAB — TROPONIN I: Troponin I: <0.03 ng/mL (ref <0.03)

## 2017-08-10 NOTE — ED Triage Notes (Signed)
Patient has had oral bleeding approx 1100 today. Patient states she went to rinse her mouth out and noticed a small amount of bright red blood. This afternoon, patient states she tasted something funny had a moderate amount of bright red blood from her mouth. Nno bleeding while in riage. patiest has 2 teeth and it does not look like they were bleeding. Patient has a small area on the roof of her mouth-like a scratch. Patient denies vomiting or having a nosebleed.

## 2017-08-10 NOTE — ED Provider Notes (Signed)
[facility] Provider Note   CSN: [id number] Arrival date & time: 08/10/17  1539     History   Chief Complaint Chief Complaint  Patient presents with  . oral bleeding    HPI Leah Anderson is a 84 y.o. female with medical history including hypertension, hyperlipidemia, essential tremor presenting today for oral bleeding in this morning as well as 1 week of hot/cold flashes in addition to "not feeling quite right in my chest".   Patient states that her oral bleeding began this morning and was like water coming out of her mouth, she states that she has one loose molar on the right side.  Patient denies any pain with bleeding, previous history of oral bleeding, recent nausea/vomiting, trauma. Patient states that this morning when her mouth started bleeding she began also not feeling quite right.  She states it became hard for her to breathe and she is still feeling that way now.  Patient is anxious in the room and states she just wants to feel better but denies any pain at this time.  Patient states that her hot/cold flashes have been occurring for 1 week, she states she becomes diaphoretic during these episodes and they pass quickly.  Patient states that she has not had these before.  She has not recorded a fever during these episodes.  HPI  Past Medical History:  Diagnosis Date  . Arthritis   . Hyperlipidemia   . Hypertension     Patient Active Problem List   Diagnosis Date Noted  . Hyperlipidemia 10/15/2013  . Unspecified essential hypertension 07/29/2013  . Anxiety 07/29/2013    Past Surgical History:  Procedure Laterality Date  . APPENDECTOMY    . CHOLECYSTECTOMY    . THYROIDECTOMY, PARTIAL       OB History   None      Home Medications    Prior to Admission medications   Medication Sig Start Date End Date Taking? Authorizing Provider  atorvastatin (LIPITOR) 40 MG tablet TAKE 1 TABLET BY MOUTH ONCE DAILY  "OV NEEDED FOR REFILLS"  05/24/17  Yes Loletta Specter, PA-C  clonazePAM (KLONOPIN) 0.5 MG tablet Take 1 tablet (0.5 mg total) by mouth at bedtime. 06/20/17  Yes Loletta Specter, PA-C  escitalopram (LEXAPRO) 10 MG tablet Take 1 tablet (10 mg total) by mouth daily. 06/20/17  Yes Loletta Specter, PA-C  ezetimibe (ZETIA) 10 MG tablet Take 1 tablet (10 mg total) by mouth daily. 05/24/17  Yes Loletta Specter, PA-C  losartan (COZAAR) 50 MG tablet Take 1 tablet (50 mg total) by mouth daily. Patient taking differently: Take 100 mg by mouth daily.  06/20/17  Yes Loletta Specter, PA-C  losartan-hydrochlorothiazide [facility]) 100-12.5 MG tablet Take 1 tablet by mouth daily. 07/25/17  Yes [provider]  naproxen sodium (ALEVE) 220 MG tablet Take 1 tablet (220 mg total) by mouth 2 (two) times daily as needed. 05/23/17  Yes Loletta Specter, PA-C  amLODipine (NORVASC) 10 MG tablet Take 1 tablet (10 mg total) by mouth daily. 05/23/17   Loletta Specter, PA-C    Family History Family History  Problem Relation Age of Onset  . Cancer Mother   . Cancer Sister     Social History Social History   Tobacco Use  . Smoking status: Former Smoker    Types: Cigarettes  . Smokeless tobacco: Never Used  Substance Use Topics  . Alcohol use: No  . Drug use: No  Allergies   Patient has no known allergies.   Review of Systems Review of Systems  Constitutional: Positive for chills and diaphoresis.  HENT: Positive for dental problem and mouth sores. Negative for ear pain, facial swelling, nosebleeds, sore throat and trouble swallowing.        Oral bleeding  Respiratory: Positive for shortness of breath. Negative for cough and wheezing.   Cardiovascular: Positive for chest pain. Negative for leg swelling.  Gastrointestinal: Negative.  Negative for abdominal pain, blood in stool, diarrhea, nausea and vomiting.  Genitourinary: Negative.  Negative for dysuria, hematuria and pelvic pain.  Musculoskeletal: Negative.   Negative for arthralgias, gait problem and myalgias.  Skin: Negative.  Negative for color change and rash.  Neurological: Positive for tremors. Negative for dizziness, syncope, weakness, numbness and headaches.  Psychiatric/Behavioral: The patient is nervous/anxious.      Physical Exam Updated Vital Signs BP 119/77   Pulse 61   Temp 98.3 F (36.8 C) (Oral)   Resp 18   Ht 5\' 3"  (1.6 m)   Wt 94.8 kg (209 lb)   SpO2 99%   BMI 37.02 kg/m   Physical Exam  Constitutional: She is oriented to person, place, and time. She appears well-developed and well-nourished. She appears distressed.  HENT:  Head: Normocephalic and atraumatic.  Mouth/Throat: Uvula is midline, oropharynx is clear and moist and mucous membranes are normal.    Patient has 3 teeth, 1 lower right molar and 1 lower left molar, and a 1 upper incisor left. Right lower molar is very loose in socket, not actively bleeding, very tender to touch.  Neck: Normal range of motion. Neck supple. No JVD present. No tracheal deviation present.  Cardiovascular: Normal rate, regular rhythm and normal heart sounds. Exam reveals no gallop and no friction rub.  No murmur heard. Pulmonary/Chest: Effort normal and breath sounds normal. No respiratory distress.  Abdominal: Soft. Bowel sounds are normal. There is no tenderness. There is no guarding.  Musculoskeletal: Normal range of motion.  No signs of DVT, swelling, color change, cords bilaterally.  Negative Homans sign bilaterally.  Neurological: She is alert and oriented to person, place, and time.  Skin: Skin is warm and dry. She is not diaphoretic.  Psychiatric: She has a normal mood and affect. Her behavior is normal.     ED Treatments / Results  Labs (all labs ordered are listed, but only abnormal results are displayed) Labs Reviewed  COMPREHENSIVE METABOLIC PANEL - Abnormal; Notable for the following components:      Result Value   Chloride 100 (*)    Glucose, Bld 104 (*)      BUN 22 (*)    Creatinine, Ser 1.31 (*)    Total Protein 8.7 (*)    ALT 10 (*)    GFR calc non Af Amer 37 (*)    GFR calc Af Amer 43 (*)    All other components within normal limits  URINALYSIS, ROUTINE W REFLEX MICROSCOPIC - Abnormal; Notable for the following components:   Leukocytes, UA TRACE (*)    All other components within normal limits  URINE CULTURE  CBC  PROTIME-INR  TROPONIN I  I-STAT TROPONIN, ED    EKG EKG Interpretation  Date/Time:  Friday August 10 2017 20:35:03 EDT Ventricular Rate:  58 PR Interval:    QRS Duration: 87 QT Interval:  425 QTC Calculation: 418 R Axis:   32 Text Interpretation:  Sinus rhythm Atrial premature complexes Abnormal R-wave progression, early transition When compared with  ECG of 05/10/2015 No significant change was found Confirmed by Samuel Jester [phone]) on 08/10/2017 8:51:28 PM   Radiology Dg Chest Port 1 View  Result Date: 08/10/2017 CLINICAL DATA:  Difficulty breathing. EXAM: PORTABLE CHEST 1 VIEW COMPARISON:  Chest x-ray dated 04/29/2015. FINDINGS: Heart size is upper normal, stable. Overall cardiomediastinal silhouette is within normal limits. Aortic atherosclerosis. Lungs are clear. No pleural effusion or pneumothorax seen. No acute or suspicious osseous finding. IMPRESSION: No active disease.  No evidence of pneumonia or pulmonary edema. Electronically Signed   By: Bary Richard M.D.   On: 08/10/2017 20:05    Procedures Procedures (including critical care time)  Medications Ordered in ED Medications - No data to display   Initial Impression / Assessment and Plan / ED Course  I have reviewed the triage vital signs and the nursing notes.  Pertinent labs & imaging results that were available during my care of the patient were reviewed by me and considered in my medical decision making (see chart for details).  Clinical Course as of Aug 10 2213  Fri Aug 10, 2017  1943 Informed nursing staff of ED chest order set, and  patients need for a monitored bed.   [BM]  2119 Informed RN of need for urine specimen   [BM]    Clinical Course User Index [BM] Bill Salinas, PA-C   Oral bleeding most likely caused by loose molar and/or small abrasion to hard palate.  No active bleeding at this time.  Patient's CBC and PT/INR were normal today.  There are no signs of perioral abscess, no swelling, erythema, bleeding.  Patient denies trouble swallowing.  Patient talking normally.  Patient encouraged to follow-up with her dentist, referral given.  Patient also encouraged to follow-up with her primary care provider which she has an appointment with on the 19th of this month.  Patient is to be discharged with recommendation to follow up with PCP in regards to today's hospital visit. Chest for is not likely of cardiac or pulmonary etiology d/t presentation, perc negative, VSS, no tracheal deviation, no JVD or new murmur, RRR, breath sounds equal bilaterally, EKG without new abnormalities, negative troponin, and negative CXR. Pt has been advised return to the ED is CP becomes exertional, associated with diaphoresis or nausea, radiates to left jaw/arm, worsens or becomes concerning in any way. Pt appears reliable for follow up and is agreeable to discharge.  It is likely that patient's chest pain is related to her anxiety disorder/premature atrial contractions.  Patient has been given extensive return precautions and verbally states understanding of return precautions.  Patient also given given physical return precautions in after visit summary.  Case has been discussed with and seen by Dr. Clarene Duke who agrees with the above plan to discharge.   Final Clinical Impressions(s) / ED Diagnoses   Final diagnoses:  Oral bleeding  Generalized anxiety disorder  Premature atrial beat    ED Discharge Orders    None       Elizabeth Palau 08/10/17 2236    Samuel Jester, DO 08/12/17 1542

## 2017-08-10 NOTE — Discharge Instructions (Addendum)
Get help right away if: You have chest pain. You have trouble breathing.  Contact a health care provider if: Your symptoms do not get better. Your symptoms get worse. You have signs of depression, such as: A persistently sad, cranky, or irritable mood. Loss of enjoyment in activities that used to bring you joy. Change in weight or eating. Changes in sleeping habits. Avoiding friends or family members. Loss of energy for normal tasks. Feelings of guilt or worthlessness. Contact a health care provider if: You have pus coming from the site of the displaced tooth. You have swelling at the site of the displaced tooth. Your pain gets much worse even after you take pain medicine. Get help right away if: You develop facial swelling. You have a fever. You have bleeding near the tooth that does not stop in 10 minutes. You have trouble swallowing. You have trouble opening your mouth.

## 2017-08-10 NOTE — ED Notes (Signed)
Pt aware urine sample is needed 

## 2017-08-12 LAB — URINE CULTURE

## 2017-08-15 ENCOUNTER — Ambulatory Visit (INDEPENDENT_AMBULATORY_CARE_PROVIDER_SITE_OTHER): Payer: Medicare HMO | Admitting: Physician Assistant

## 2017-08-15 ENCOUNTER — Encounter (INDEPENDENT_AMBULATORY_CARE_PROVIDER_SITE_OTHER): Payer: Self-pay | Admitting: Physician Assistant

## 2017-08-15 ENCOUNTER — Other Ambulatory Visit: Payer: Self-pay

## 2017-08-15 VITALS — BP 127/49 | HR 52 | Temp 97.3°F | Ht 66.5 in | Wt 201.6 lb

## 2017-08-15 DIAGNOSIS — F411 Generalized anxiety disorder: Secondary | ICD-10-CM | POA: Diagnosis not present

## 2017-08-15 DIAGNOSIS — R6883 Chills (without fever): Secondary | ICD-10-CM

## 2017-08-15 DIAGNOSIS — G4700 Insomnia, unspecified: Secondary | ICD-10-CM

## 2017-08-15 DIAGNOSIS — R69 Illness, unspecified: Secondary | ICD-10-CM | POA: Diagnosis not present

## 2017-08-15 DIAGNOSIS — I10 Essential (primary) hypertension: Secondary | ICD-10-CM

## 2017-08-15 LAB — POCT URINALYSIS DIPSTICK
BILIRUBIN UA: NEGATIVE
Glucose, UA: NEGATIVE
Ketones, UA: NEGATIVE
Leukocytes, UA: NEGATIVE
Nitrite, UA: NEGATIVE
Protein, UA: NEGATIVE
RBC UA: NEGATIVE
Spec Grav, UA: 1.01 (ref 1.010–1.025)
Urobilinogen, UA: 0.2 E.U./dL
pH, UA: 5.5 (ref 5.0–8.0)

## 2017-08-15 MED ORDER — LOSARTAN POTASSIUM 100 MG PO TABS: 100.0000 mg | ORAL_TABLET | Freq: Every day | ORAL | 3 refills | Status: DC

## 2017-08-15 MED ORDER — CLONAZEPAM 0.5 MG PO TABS: 0.5000 mg | ORAL_TABLET | Freq: Every day | ORAL | 0 refills | Status: DC

## 2017-08-15 NOTE — Patient Instructions (Signed)

## 2017-08-15 NOTE — Progress Notes (Signed)
Subjective:  Patient ID: Leah Anderson, female    DOB: [birth year]  Age: 84 y.o. MRN: [id number]  CC: f/u HTN  HPI Leah Anderson a 84 y.o.femalewith a medical history of HTN, HLD, GAD, OA L knee, essential tremor, poor memory, insomnia, orthostatic hypotension, and CAP presents to f/u on HTN. Last BP 148/74 mmHg two months ago. Losartan 50 mg added to amlodipine 10 mg. Pt seems to have been prescribed Hyzaar at outside clinic and is taking concurrently with Losartan 50 mg. Pt states she is not able to sleep. Feels chills and hot flashes. Recent ED visit for the same revealed trace lueks on UA but urine cx could not be determined. She was prescribed clonazepam 0.5 mg and escitalopram 10 mg which seemed to have controlled her anxiety and insomnia previously. Ran out of clonazepam 0.5 mg four weeks ago and stopped taking Lexapro 4 days ago due to suspecting chills were due to Lexapro. Does not endorse CP, palpitations, SOB, HA, tingling, numbness, weakness, abdominal pain, f/c/n/v, rash, swelling, or GI/GU sxs.        Outpatient Medications Prior to Visit  Medication Sig Dispense Refill  . amLODipine (NORVASC) 10 MG tablet Take 1 tablet (10 mg total) by mouth daily. 90 tablet 1  . atorvastatin (LIPITOR) 40 MG tablet TAKE 1 TABLET BY MOUTH ONCE DAILY  "OV NEEDED FOR REFILLS" 30 tablet 11  . clonazePAM (KLONOPIN) 0.5 MG tablet Take 1 tablet (0.5 mg total) by mouth at bedtime. 30 tablet 0  . escitalopram (LEXAPRO) 10 MG tablet Take 1 tablet (10 mg total) by mouth daily. 30 tablet 5  . ezetimibe (ZETIA) 10 MG tablet Take 1 tablet (10 mg total) by mouth daily. 30 tablet 5  . losartan-hydrochlorothiazide (HYZAAR) 100-12.5 MG tablet Take 1 tablet by mouth daily.  0  . naproxen sodium (ALEVE) 220 MG tablet Take 1 tablet (220 mg total) by mouth 2 (two) times daily as needed. 60 tablet 0  . losartan (COZAAR) 50 MG tablet Take 1 tablet (50 mg total) by mouth daily. (Patient taking differently: Take 100  mg by mouth daily. ) 90 tablet 3   No facility-administered medications prior to visit.      ROS Review of Systems  Constitutional: Positive for chills. Negative for fever and malaise/fatigue.  Eyes: Negative for blurred vision.  Respiratory: Negative for shortness of breath.   Cardiovascular: Negative for chest pain and palpitations.  Gastrointestinal: Negative for abdominal pain and nausea.  Genitourinary: Negative for dysuria and hematuria.  Musculoskeletal: Negative for joint pain and myalgias.  Skin: Negative for rash.  Neurological: Negative for tingling and headaches.  Psychiatric/Behavioral: Negative for depression. The patient is nervous/anxious and has insomnia.     Objective:  BP (!) 127/49 (BP Location: Left Arm, Patient Position: Sitting, Cuff Size: Large)   Pulse (!) 52   Temp (!) 97.3 F (36.3 C) (Oral)   Ht 5' 6.5" (1.689 m)   Wt 201 lb 9.6 oz (91.4 kg)   SpO2 100%   BMI 32.05 kg/m   BP/Weight 08/15/2017 08/10/2017 06/20/2017  Systolic BP 127 138 148  Diastolic BP 49 71 74  Wt. (Lbs) 201.6 209 206.8  BMI 32.05 37.02 32.88      Physical Exam  Constitutional: She is oriented to person, place, and time.  Well developed, well nourished, NAD, polite  HENT:  Head: Normocephalic and atraumatic.  Eyes: No scleral icterus.  Neck: Normal range of motion. Neck supple. No thyromegaly present.  Cardiovascular: Normal rate,  regular rhythm and normal heart sounds.  Pulmonary/Chest: Effort normal and breath sounds normal.  Musculoskeletal: She exhibits no edema.  Neurological: She is alert and oriented to person, place, and time.  Skin: Skin is warm and dry. No rash noted. No erythema. No pallor.  Psychiatric: Her behavior is normal. Thought content normal.  anxious  Vitals reviewed.    Assessment & Plan:    1. GAD (generalized anxiety disorder) - Begin clonazePAM (KLONOPIN) 0.5 MG tablet; Take 1 tablet (0.5 mg total) by mouth at bedtime.  Dispense: 30  tablet; Refill: 0 - Ambulatory referral to Psychiatry  2. Insomnia, unspecified type - Begin clonazePAM (KLONOPIN) 0.5 MG tablet; Take 1 tablet (0.5 mg total) by mouth at bedtime.  Dispense: 30 tablet; Refill: 0 - Continue Lexapro 10 mg one tablet po qday - Ambulatory referral to Psychiatry  3. Chills - Urinalysis Dipstick normal. No urine culture resubmission necessary. - CBC with Differential - Basic Metabolic Panel  4. Hypertension, unspecified type - Basic Metabolic Panel - Hyzaar was ordered by outside provider while patient already on Losartan.     Meds ordered this encounter  Medications  . clonazePAM (KLONOPIN) 0.5 MG tablet    Sig: Take 1 tablet (0.5 mg total) by mouth at bedtime.    Dispense:  30 tablet    Refill:  0    Order Specific Question:   Supervising Provider    Answer:   Hoy RegisterNEWLIN, ENOBONG [[id number]]  . losartan (COZAAR) 100 MG tablet    Sig: Take 1 tablet (100 mg total) by mouth daily.    Dispense:  90 tablet    Refill:  3    Order Specific Question:   Supervising Provider    Answer:   Hoy RegisterNEWLIN, ENOBONG [[id number]]    Follow-up: 4 weeks anxiety  Loletta Specteroger David Caysen Whang PA

## 2017-08-16 LAB — CBC WITH DIFFERENTIAL/PLATELET
BASOS: 0 %
Basophils Absolute: 0 10*3/uL (ref 0.0–0.2)
EOS (ABSOLUTE): 0 10*3/uL (ref 0.0–0.4)
Eos: 1 %
Hematocrit: 37.6 % (ref 34.0–46.6)
Hemoglobin: 12.3 g/dL (ref 11.1–15.9)
IMMATURE GRANULOCYTES: 0 %
Immature Grans (Abs): 0 10*3/uL (ref 0.0–0.1)
Lymphocytes Absolute: 1.6 10*3/uL (ref 0.7–3.1)
Lymphs: 33 %
MCH: 28.3 pg (ref 26.6–33.0)
MCHC: 32.7 g/dL (ref 31.5–35.7)
MCV: 87 fL (ref 79–97)
Monocytes Absolute: 0.4 10*3/uL (ref 0.1–0.9)
Monocytes: 7 %
NEUTROS ABS: 2.9 10*3/uL (ref 1.4–7.0)
NEUTROS PCT: 59 %
PLATELETS: 256 10*3/uL (ref 150–450)
RBC: 4.34 x10E6/uL (ref 3.77–5.28)
RDW: 13.5 % (ref 12.3–15.4)
WBC: 5 10*3/uL (ref 3.4–10.8)

## 2017-08-16 LAB — BASIC METABOLIC PANEL
BUN / CREAT RATIO: 14 (ref 12–28)
BUN: 24 mg/dL (ref 8–27)
CHLORIDE: 101 mmol/L (ref 96–106)
CO2: 23 mmol/L (ref 20–29)
Calcium: 9.6 mg/dL (ref 8.7–10.3)
Creatinine, Ser: 1.7 mg/dL — ABNORMAL HIGH (ref 0.57–1.00)
GFR calc non Af Amer: 28 mL/min/{1.73_m2} — ABNORMAL LOW (ref >59)
GFR, EST AFRICAN AMERICAN: 32 mL/min/{1.73_m2} — AB (ref >59)
Glucose: 90 mg/dL (ref 65–99)
POTASSIUM: 3.6 mmol/L (ref 3.5–5.2)
Sodium: 142 mmol/L (ref 134–144)

## 2017-08-17 ENCOUNTER — Telehealth (INDEPENDENT_AMBULATORY_CARE_PROVIDER_SITE_OTHER): Payer: Self-pay

## 2017-08-17 NOTE — Telephone Encounter (Signed)
-----   Message from Loletta Specteroger David Gomez, PA-C sent at 08/16/2017  8:35 AM EDT ----- Some impairment of kidney function. I believe this may be due to having taken too much Losartan. I have printed her medication list and explained to her that no other medication should be taken aside from what is on the medication list. Please reinforce this advise. She will have BMP redrawn for kidney function at her f/u.

## 2017-08-17 NOTE — Telephone Encounter (Signed)
Spoke with annalisa patients daughter. She is aware that patient has some impairment of her kidney function that her PCP believes is due to her taking too much losartan. She is also aware that PCP printed medication list and thoroughly explained to patient. Reinforced that patient is to only take medications that are on the list. We will recheck her kidney function at her follow up appointment. Daughter will inform patient when she wakes. Maryjean Mornempestt S Svea Pusch, CMA

## 2017-08-23 ENCOUNTER — Telehealth (INDEPENDENT_AMBULATORY_CARE_PROVIDER_SITE_OTHER): Payer: Self-pay | Admitting: Physician Assistant

## 2017-08-23 NOTE — Telephone Encounter (Signed)
If patient is to take estrogen long term then I will need to refer to GYN. However, "hot flashes" can be mistaken for night sweats which may be a sign of something more ominous and would need a full evaluation with PCP.

## 2017-08-23 NOTE — Telephone Encounter (Signed)
Patient called stating she would like to take estrogen for hot flashes and night sweats. She wants to know if it is safe to take estrogen with all her other medications.   Please Advise  Thank You

## 2017-08-24 NOTE — Telephone Encounter (Signed)
Patient aware to not take estrogen until PCP evaluates and advises at next OV on 7/17. Maryjean Mornempestt S Roberts, CMA

## 2017-09-12 ENCOUNTER — Ambulatory Visit (INDEPENDENT_AMBULATORY_CARE_PROVIDER_SITE_OTHER): Payer: Medicare HMO | Admitting: Physician Assistant

## 2017-09-12 ENCOUNTER — Encounter (INDEPENDENT_AMBULATORY_CARE_PROVIDER_SITE_OTHER): Payer: Self-pay | Admitting: Physician Assistant

## 2017-09-12 ENCOUNTER — Other Ambulatory Visit: Payer: Self-pay

## 2017-09-12 VITALS — BP 162/73 | HR 64 | Temp 97.9°F | Ht 66.5 in | Wt 207.0 lb

## 2017-09-12 DIAGNOSIS — F411 Generalized anxiety disorder: Secondary | ICD-10-CM | POA: Diagnosis not present

## 2017-09-12 DIAGNOSIS — G4700 Insomnia, unspecified: Secondary | ICD-10-CM | POA: Diagnosis not present

## 2017-09-12 DIAGNOSIS — R7989 Other specified abnormal findings of blood chemistry: Secondary | ICD-10-CM

## 2017-09-12 DIAGNOSIS — R69 Illness, unspecified: Secondary | ICD-10-CM | POA: Diagnosis not present

## 2017-09-12 MED ORDER — CLONAZEPAM 0.5 MG PO TABS: 0.5000 mg | ORAL_TABLET | Freq: Two times a day (BID) | ORAL | 0 refills | Status: DC

## 2017-09-12 NOTE — Progress Notes (Signed)
Subjective:  Patient ID: Leah Anderson, female    DOB: 05-22-1933  Age: 84 y.o. MRN: [id number]  CC: f/u anxiety  HPI  Leah Anderson a 84 y.o.femalewith a medical history of HTN, HLD, GAD, OA L knee, essential tremor, poor memory, insomnia, orthostatic hypotension, and CAP presentsto f/u on anxiety. Seen nearly one month ago and was advised to continue on Lexapro and to begin Clonazepam 0.5mg  again. She was also referred to psychiatry but has not heard from anyone for an appointment. Feels well when taking Lexapro and Clonazepam but says she is a "nervous wreck" right now because she has a son that has recently become very ill. She is planning on travelling to [place] to see her son in hospital.     Pt brings her medication and shows she is mistakenly taking Losartan 100mg  and an older prescription of Losartan 50 mg. Had an elevated serum creatinine. Does not endorse back pain, f/c/n/v, hematuria, CP, palpitations, SOB, HA, abdominal pain, rash, swelling, or GU/GI sxs.      Outpatient Medications Prior to Visit  Medication Sig Dispense Refill  . amLODipine (NORVASC) 10 MG tablet Take 1 tablet (10 mg total) by mouth daily. 90 tablet 1  . atorvastatin (LIPITOR) 40 MG tablet TAKE 1 TABLET BY MOUTH ONCE DAILY  "OV NEEDED FOR REFILLS" 30 tablet 11  . clonazePAM (KLONOPIN) 0.5 MG tablet Take 1 tablet (0.5 mg total) by mouth at bedtime. 30 tablet 0  . escitalopram (LEXAPRO) 10 MG tablet Take 1 tablet (10 mg total) by mouth daily. 30 tablet 5  . ezetimibe (ZETIA) 10 MG tablet Take 1 tablet (10 mg total) by mouth daily. 30 tablet 5  . losartan (COZAAR) 100 MG tablet Take 1 tablet (100 mg total) by mouth daily. 90 tablet 3  . naproxen sodium (ALEVE) 220 MG tablet Take 1 tablet (220 mg total) by mouth 2 (two) times daily as needed. 60 tablet 0   No facility-administered medications prior to visit.      ROS Review of Systems  Constitutional: Negative for chills, fever and malaise/fatigue.  Eyes:  Negative for blurred vision.  Respiratory: Negative for shortness of breath.   Cardiovascular: Negative for chest pain and palpitations.  Gastrointestinal: Negative for abdominal pain and nausea.  Genitourinary: Negative for dysuria and hematuria.  Musculoskeletal: Negative for joint pain and myalgias.  Skin: Negative for rash.  Neurological: Negative for tingling and headaches.  Psychiatric/Behavioral: Negative for depression. The patient is nervous/anxious.     Objective:  There were no vitals taken for this visit.  BP/Weight 08/15/2017 08/10/2017 06/20/2017  Systolic BP 127 138 148  Diastolic BP 49 71 74  Wt. (Lbs) 201.6 209 206.8  BMI 32.05 37.02 32.88      Physical Exam  Constitutional: She is oriented to person, place, and time.  Well developed, well nourished, NAD, polite  HENT:  Head: Normocephalic and atraumatic.  Eyes: No scleral icterus.  Neck: Normal range of motion. Neck supple. No thyromegaly present.  Cardiovascular: Normal rate, regular rhythm and normal heart sounds.  No murmur heard. No LE edema bilaterally  Pulmonary/Chest: Effort normal and breath sounds normal.  Musculoskeletal: She exhibits no edema.  Neurological: She is alert and oriented to person, place, and time.  Skin: Skin is warm and dry. No rash noted. No erythema. No pallor.  Psychiatric: Her behavior is normal. Thought content normal.  Very anxious, tremulous, hard to sit still, tearful.  Vitals reviewed.    Assessment & Plan:  1. GAD (generalized anxiety disorder) - Increase temporarily clonazePAM (KLONOPIN) 0.5 MG tablet; Take 1 tablet (0.5 mg total) by mouth 2 (two) times daily.  Dispense: 60 tablet; Refill: 0  2. Insomnia, unspecified type - Increase temporarily clonazePAM (KLONOPIN) 0.5 MG tablet; Take 1 tablet (0.5 mg total) by mouth 2 (two) times daily.  Dispense: 60 tablet; Refill: 0  3. Elevated serum creatinine - Basic Metabolic Panel - I have instructed patient once again  that she shall only take Losartan 100 mg daily. I have marked a large X on her Losartan 50 mg bottle. Pt states she will throw away Losartan 50 mg.      Meds ordered this encounter  Medications  . clonazePAM (KLONOPIN) 0.5 MG tablet    Sig: Take 1 tablet (0.5 mg total) by mouth 2 (two) times daily.    Dispense:  60 tablet    Refill:  0    Order Specific Question:   Supervising Provider    Answer:   Hoy RegisterNEWLIN, ENOBONG [[id number]]    Follow-up: Return in about 1 month (around 10/10/2017) for anxiety.   Loletta Specteroger David Gomez PA

## 2017-09-12 NOTE — Patient Instructions (Signed)
Clonazepam tablets What is this medicine? CLONAZEPAM (kloe NA ze pam) is a benzodiazepine. It is used to treat certain types of seizures. It is also used to treat panic disorder. This medicine may be used for other purposes; ask your health care provider or pharmacist if you have questions. COMMON BRAND NAME(S): Ceberclon, Klonopin What should I tell my health care provider before I take this medicine? They need to know if you have any of these conditions: -an alcohol or drug abuse problem -bipolar disorder, depression, psychosis or other mental health condition -glaucoma -kidney or liver disease -lung or breathing disease -myasthenia gravis -Parkinson's disease -porphyria -seizures or a history of seizures -suicidal thoughts -an unusual or allergic reaction to clonazepam, other benzodiazepines, foods, dyes, or preservatives -pregnant or trying to get pregnant -breast-feeding How should I use this medicine? Take this medicine by mouth with a glass of water. Follow the directions on the prescription label. If it upsets your stomach, take it with food or milk. Take your medicine at regular intervals. Do not take it more often than directed. Do not stop taking or change the dose except on the advice of your doctor or health care professional. A special MedGuide will be given to you by the pharmacist with each prescription and refill. Be sure to read this information carefully each time. Talk to your pediatrician regarding the use of this medicine in children. Special care may be needed. Overdosage: If you think you have taken too much of this medicine contact a poison control center or emergency room at once. NOTE: This medicine is only for you. Do not share this medicine with others. What if I miss a dose? If you miss a dose, take it as soon as you can. If it is almost time for your next dose, take only that dose. Do not take double or extra doses. What may interact with this medicine? Do  not take this medication with any of the following medicines: -narcotic medicines for cough -sodium oxybate This medicine may also interact with the following medications: -alcohol -antihistamines for allergy, cough and cold -antiviral medicines for HIV or AIDS -certain medicines for anxiety or sleep -certain medicines for depression, like amitriptyline, fluoxetine, sertraline -certain medicines for fungal infections like ketoconazole and itraconazole -certain medicines for seizures like carbamazepine, phenobarbital, phenytoin, primidone -general anesthetics like halothane, isoflurane, methoxyflurane, propofol -local anesthetics like lidocaine, pramoxine, tetracaine -medicines that relax muscles for surgery -narcotic medicines for pain -phenothiazines like chlorpromazine, mesoridazine, prochlorperazine, thioridazine This list may not describe all possible interactions. Give your health care provider a list of all the medicines, herbs, non-prescription drugs, or dietary supplements you use. Also tell them if you smoke, drink alcohol, or use illegal drugs. Some items may interact with your medicine. What should I watch for while using this medicine? Tell your doctor or health care professional if your symptoms do not start to get better or if they get worse. Do not stop taking except on your doctor's advice. You may develop a severe reaction. Your doctor will tell you how much medicine to take. You may get drowsy or dizzy. Do not drive, use machinery, or do anything that needs mental alertness until you know how this medicine affects you. To reduce the risk of dizzy and fainting spells, do not stand or sit up quickly, especially if you are an older patient. Alcohol may increase dizziness and drowsiness. Avoid alcoholic drinks. If you are taking another medicine that also causes drowsiness, you may have more side   effects. Give your health care provider a list of all medicines you use. Your doctor  will tell you how much medicine to take. Do not take more medicine than directed. Call emergency for help if you have problems breathing or unusual sleepiness. The use of this medicine may increase the chance of suicidal thoughts or actions. Pay special attention to how you are responding while on this medicine. Any worsening of mood, or thoughts of suicide or dying should be reported to your health care professional right away. What side effects may I notice from receiving this medicine? Side effects that you should report to your doctor or health care professional as soon as possible: -allergic reactions like skin rash, itching or hives, swelling of the face, lips, or tongue -breathing problems -confusion -loss of balance or coordination -signs and symptoms of low blood pressure like dizziness; feeling faint or lightheaded, falls; unusually weak or tired -suicidal thoughts or mood changes Side effects that usually do not require medical attention (report to your doctor or health care professional if they continue or are bothersome): -dizziness -headache -tiredness -upset stomach This list may not describe all possible side effects. Call your doctor for medical advice about side effects. You may report side effects to FDA at 1-800-FDA-1088. Where should I keep my medicine? Keep out of the reach of children. This medicine can be abused. Keep your medicine in a safe place to protect it from theft. Do not share this medicine with anyone. Selling or giving away this medicine is dangerous and against the law. This medicine may cause accidental overdose and death if taken by other adults, children, or pets. Mix any unused medicine with a substance like cat litter or coffee grounds. Then throw the medicine away in a sealed container like a sealed bag or a coffee can with a lid. Do not use the medicine after the expiration date. Store at room temperature between 15 and 30 degrees C (59 and 86 degrees F).  Protect from light. Keep container tightly closed. NOTE: This sheet is a summary. It may not cover all possible information. If you have questions about this medicine, talk to your doctor, pharmacist, or health care provider.  2018 Elsevier/Gold Standard (2015-07-23 18:46:32)  

## 2017-09-13 LAB — BASIC METABOLIC PANEL
BUN / CREAT RATIO: 8 — AB (ref 12–28)
BUN: 10 mg/dL (ref 8–27)
CO2: 24 mmol/L (ref 20–29)
Calcium: 9.9 mg/dL (ref 8.7–10.3)
Chloride: 105 mmol/L (ref 96–106)
Creatinine, Ser: 1.21 mg/dL — ABNORMAL HIGH (ref 0.57–1.00)
GFR calc Af Amer: 48 mL/min/{1.73_m2} — ABNORMAL LOW (ref >59)
GFR, EST NON AFRICAN AMERICAN: 42 mL/min/{1.73_m2} — AB (ref >59)
Glucose: 94 mg/dL (ref 65–99)
Potassium: 5 mmol/L (ref 3.5–5.2)
SODIUM: 142 mmol/L (ref 134–144)

## 2017-09-14 ENCOUNTER — Telehealth (INDEPENDENT_AMBULATORY_CARE_PROVIDER_SITE_OTHER): Payer: Self-pay

## 2017-09-14 NOTE — Telephone Encounter (Signed)
Patient is aware. [name], CMA  

## 2017-09-14 NOTE — Telephone Encounter (Signed)
-----   Message from Loletta Specteroger David Gomez, PA-C sent at 09/13/2017  8:49 AM EDT ----- Kidney filtration improved over previous reading.

## 2017-09-21 ENCOUNTER — Other Ambulatory Visit: Payer: Medicare HMO

## 2017-11-22 ENCOUNTER — Telehealth (INDEPENDENT_AMBULATORY_CARE_PROVIDER_SITE_OTHER): Payer: Self-pay | Admitting: Physician Assistant

## 2017-11-22 NOTE — Telephone Encounter (Signed)
Pt should have established a psychiatrist by now. Also, I do not refill Benzodiazepines over the phone. Lastly, she should call her pharmacy and confirm if the Losartan she was taking was produced by the company in question.

## 2017-11-22 NOTE — Telephone Encounter (Signed)
Patient called stating that she has a really bad headache and has stopped taking her medication due to seeing on the news that it could cause kidney diease and cancer. Patient would like to take a different medication for Blood Pressure.  Patient would also like a refill for clonazePAM (KLONOPIN) 0.5 MG tablet  Patient uses [facility] [phone] - [place], [place] - [address]  Please advice [phone]  Thank you Louisa Second

## 2017-11-22 NOTE — Telephone Encounter (Signed)
Per front desk associate Eulogio Bear patient stated she stopped taking her losartan. Please advise. Maryjean Morn, CMA

## 2017-11-23 NOTE — Telephone Encounter (Signed)
Left message asking patient to return call to [facility]. Kariana Wiles S Lasha Echeverria, CMA  

## 2017-11-27 NOTE — Telephone Encounter (Signed)
Patient is aware to contact pharmacist to see if her losartan was produced by company in question. Patient states she started back taking losartan because she started having really bad headaches after she stopped. Still encouraged patient to check with pharmacist. Maryjean Morn, CMA

## 2017-11-28 ENCOUNTER — Other Ambulatory Visit (INDEPENDENT_AMBULATORY_CARE_PROVIDER_SITE_OTHER): Payer: Self-pay | Admitting: Physician Assistant

## 2017-11-29 NOTE — Telephone Encounter (Signed)
FWD to PCP. [name], CMA  

## 2017-12-22 DIAGNOSIS — E785 Hyperlipidemia, unspecified: Secondary | ICD-10-CM | POA: Diagnosis not present

## 2017-12-22 DIAGNOSIS — Z6836 Body mass index (BMI) 36.0-36.9, adult: Secondary | ICD-10-CM | POA: Diagnosis not present

## 2017-12-22 DIAGNOSIS — I10 Essential (primary) hypertension: Secondary | ICD-10-CM | POA: Diagnosis not present

## 2017-12-22 DIAGNOSIS — R69 Illness, unspecified: Secondary | ICD-10-CM | POA: Diagnosis not present

## 2017-12-22 DIAGNOSIS — E669 Obesity, unspecified: Secondary | ICD-10-CM | POA: Diagnosis not present

## 2018-01-02 ENCOUNTER — Other Ambulatory Visit (INDEPENDENT_AMBULATORY_CARE_PROVIDER_SITE_OTHER): Payer: Self-pay | Admitting: Physician Assistant

## 2018-01-02 NOTE — Telephone Encounter (Signed)
FWD to PCP. [name], CMA  

## 2018-01-17 ENCOUNTER — Other Ambulatory Visit (INDEPENDENT_AMBULATORY_CARE_PROVIDER_SITE_OTHER): Payer: Self-pay | Admitting: Physician Assistant

## 2018-01-17 DIAGNOSIS — F411 Generalized anxiety disorder: Secondary | ICD-10-CM

## 2018-01-17 NOTE — Telephone Encounter (Signed)
FWD to PCP. Acel Natzke S Tephanie Escorcia, CMA  

## 2018-01-22 ENCOUNTER — Encounter (INDEPENDENT_AMBULATORY_CARE_PROVIDER_SITE_OTHER): Payer: Self-pay | Admitting: Physician Assistant

## 2018-01-22 ENCOUNTER — Ambulatory Visit (INDEPENDENT_AMBULATORY_CARE_PROVIDER_SITE_OTHER): Payer: Medicare HMO | Admitting: Physician Assistant

## 2018-01-22 VITALS — BP 137/70 | HR 72 | Temp 97.5°F | Resp 18 | Ht 67.0 in | Wt 209.0 lb

## 2018-01-22 DIAGNOSIS — G4709 Other insomnia: Secondary | ICD-10-CM

## 2018-01-22 DIAGNOSIS — I10 Essential (primary) hypertension: Secondary | ICD-10-CM

## 2018-01-22 DIAGNOSIS — R350 Frequency of micturition: Secondary | ICD-10-CM | POA: Diagnosis not present

## 2018-01-22 DIAGNOSIS — F5105 Insomnia due to other mental disorder: Secondary | ICD-10-CM

## 2018-01-22 DIAGNOSIS — Z760 Encounter for issue of repeat prescription: Secondary | ICD-10-CM | POA: Diagnosis not present

## 2018-01-22 DIAGNOSIS — F411 Generalized anxiety disorder: Secondary | ICD-10-CM

## 2018-01-22 DIAGNOSIS — R8281 Pyuria: Secondary | ICD-10-CM

## 2018-01-22 DIAGNOSIS — R69 Illness, unspecified: Secondary | ICD-10-CM | POA: Diagnosis not present

## 2018-01-22 DIAGNOSIS — F99 Mental disorder, not otherwise specified: Secondary | ICD-10-CM

## 2018-01-22 LAB — POCT URINALYSIS DIPSTICK
Bilirubin, UA: NEGATIVE
Blood, UA: NEGATIVE
Glucose, UA: NEGATIVE
Ketones, UA: NEGATIVE
NITRITE UA: NEGATIVE
PH UA: 5.5 (ref 5.0–8.0)
PROTEIN UA: POSITIVE — AB
Spec Grav, UA: 1.025 (ref 1.010–1.025)
UROBILINOGEN UA: 0.2 U/dL

## 2018-01-22 MED ORDER — ZOLPIDEM TARTRATE 5 MG PO TABS: 5.0000 mg | ORAL_TABLET | Freq: Every evening | ORAL | 1 refills | Status: DC | PRN

## 2018-01-22 MED ORDER — AMLODIPINE BESYLATE 10 MG PO TABS: 10.0000 mg | ORAL_TABLET | Freq: Every day | ORAL | 1 refills | Status: DC

## 2018-01-22 MED ORDER — CIPROFLOXACIN HCL 500 MG PO TABS: 500.0000 mg | ORAL_TABLET | Freq: Two times a day (BID) | ORAL | 0 refills | Status: AC

## 2018-01-22 MED ORDER — CLONAZEPAM 0.5 MG PO TABS: 0.5000 mg | ORAL_TABLET | Freq: Every day | ORAL | 0 refills | Status: DC | PRN

## 2018-01-22 MED ORDER — ATORVASTATIN CALCIUM 40 MG PO TABS: ORAL_TABLET | ORAL | 11 refills | Status: DC

## 2018-01-22 MED ORDER — ESCITALOPRAM OXALATE 20 MG PO TABS: 20.0000 mg | ORAL_TABLET | Freq: Every day | ORAL | 5 refills | Status: DC

## 2018-01-22 MED ORDER — EZETIMIBE 10 MG PO TABS: 10.0000 mg | ORAL_TABLET | Freq: Every day | ORAL | 5 refills | Status: DC

## 2018-01-22 NOTE — Progress Notes (Signed)
Subjective:  Patient ID: Leah FossaEthel Anderson, female    DOB: 10/23/1933  Age: 84 y.o. MRN: [id number]  CC: annual exam  HPI Leah Sarthel McCloudis a 84 y.o.femalewith a medical history of HTN, HLD, GAD, OA L knee, essential tremor, poor memory, insomnia, orthostatic hypotension, and CAP presents with urinary frequency since approximately one week ago. Reports going to urinate approximately every hour. Has irritated labia due to wiping so much. Used Vagisil to help relieve irritated labia. Denies hematuria, dysuria, suprapubic pressure, flank pain, back pain, fever, chills, nausea, or vomiting.     Patient also has difficulty sleeping. Wakes up at 2:00 am with difficulty falling back to sleep. Stays awake and thinks about the recent passing of her sister.   Outpatient Medications Prior to Visit  Medication Sig Dispense Refill  . amLODipine (NORVASC) 10 MG tablet Take 1 tablet (10 mg total) by mouth daily. 90 tablet 1  . atorvastatin (LIPITOR) 40 MG tablet TAKE 1 TABLET BY MOUTH ONCE DAILY  "OV NEEDED FOR REFILLS" 30 tablet 11  . clonazePAM (KLONOPIN) 0.5 MG tablet Take 1 tablet (0.5 mg total) by mouth 2 (two) times daily. 60 tablet 0  . escitalopram (LEXAPRO) 10 MG tablet TAKE 1 TABLET BY MOUTH DAILY 30 tablet 0  . ezetimibe (ZETIA) 10 MG tablet TAKE 1 TABLET BY MOUTH DAILY 30 tablet 5  . ezetimibe (ZETIA) 10 MG tablet TAKE 1 TABLET BY MOUTH DAILY 30 tablet 0  . losartan (COZAAR) 100 MG tablet Take 1 tablet (100 mg total) by mouth daily. (Patient not taking: Reported on 01/22/2018) 90 tablet 3  . naproxen sodium (ALEVE) 220 MG tablet Take 1 tablet (220 mg total) by mouth 2 (two) times daily as needed. (Patient not taking: Reported on 09/12/2017) 60 tablet 0   No facility-administered medications prior to visit.      ROS Review of Systems  Constitutional: Negative for chills, fever and malaise/fatigue.  Eyes: Negative for blurred vision.  Respiratory: Negative for shortness of breath.    Cardiovascular: Negative for chest pain and palpitations.  Gastrointestinal: Negative for abdominal pain and nausea.  Genitourinary: Positive for frequency. Negative for dysuria and hematuria.  Musculoskeletal: Negative for joint pain and myalgias.  Skin: Negative for rash.  Neurological: Negative for tingling and headaches.  Psychiatric/Behavioral: Negative for depression. The patient is not nervous/anxious.     Objective:  BP 137/70 (BP Location: Right Arm, Patient Position: Sitting, Cuff Size: Large)   Pulse 72   Temp (!) 97.5 F (36.4 C) (Oral)   Resp 18   Ht 5\' 7"  (1.702 m)   Wt 209 lb (94.8 kg)   SpO2 97%   BMI 32.73 kg/m   BP/Weight 01/22/2018 09/12/2017 08/15/2017  Systolic BP 137 162 127  Diastolic BP 70 73 49  Wt. (Lbs) 209 207 201.6  BMI 32.73 32.91 32.05      Physical Exam  Constitutional: She is oriented to person, place, and time.  Well developed, well nourished, NAD, polite  HENT:  Head: Normocephalic and atraumatic.  Eyes: No scleral icterus.  Neck: Normal range of motion. Neck supple. No thyromegaly present.  Cardiovascular: Normal rate, regular rhythm and normal heart sounds.  Pulmonary/Chest: Effort normal and breath sounds normal.  Abdominal: Soft. Bowel sounds are normal. There is no tenderness.  Musculoskeletal: She exhibits no edema.  Neurological: She is alert and oriented to person, place, and time.  Skin: Skin is warm and dry. No rash noted. No erythema. No pallor.  Psychiatric: Her behavior is  normal. Thought content normal.  Moderately anxious  Vitals reviewed.    Assessment & Plan:     1. Urinary frequency - Urinalysis Dipstick with small leukocytes - ciprofloxacin (CIPRO) 500 MG tablet; Take 1 tablet (500 mg total) by mouth 2 (two) times daily for 5 days.  Dispense: 10 tablet; Refill: 0 - Urine Culture  2. Pyuria - ciprofloxacin (CIPRO) 500 MG tablet; Take 1 tablet (500 mg total) by mouth 2 (two) times daily for 5 days.   Dispense: 10 tablet; Refill: 0 - Urine Culture  3. Insomnia due to other mental disorder - Begin zolpidem (AMBIEN) 5 MG tablet; Take 1 tablet (5 mg total) by mouth at bedtime as needed for sleep.  Dispense: 30 tablet; Refill: 1  4. Hypertension, unspecified type - amLODipine (NORVASC) 10 MG tablet; Take 1 tablet (10 mg total) by mouth daily.  Dispense: 90 tablet; Refill: 1 - Lipid panel - Comprehensive metabolic panel  5. GAD (generalized anxiety disorder) - Increase escitalopram (LEXAPRO) 20 MG tablet; Take 1 tablet (20 mg total) by mouth daily.  Dispense: 30 tablet; Refill: 5 - Begin Clonazepam 0.5 mg qday prn. Instructed to use only if anxiety is overwhelming. Pt understood and said she will use as instructed and does not think she will be using too often.   6. Medication refill - ezetimibe (ZETIA) 10 MG tablet; Take 1 tablet (10 mg total) by mouth daily.  Dispense: 30 tablet; Refill: 5 - atorvastatin (LIPITOR) 40 MG tablet; TAKE 1 TABLET BY MOUTH ONCE DAILY  "OV NEEDED FOR REFILLS"  Dispense: 30 tablet; Refill: 11   Meds ordered this encounter  Medications  . ciprofloxacin (CIPRO) 500 MG tablet    Sig: Take 1 tablet (500 mg total) by mouth 2 (two) times daily for 5 days.    Dispense:  10 tablet    Refill:  0    Order Specific Question:   Supervising Provider    Answer:   Hoy Register [[id number]]  . zolpidem (AMBIEN) 5 MG tablet    Sig: Take 1 tablet (5 mg total) by mouth at bedtime as needed for sleep.    Dispense:  30 tablet    Refill:  1    Order Specific Question:   Supervising Provider    Answer:   Hoy Register [[id number]]  . escitalopram (LEXAPRO) 20 MG tablet    Sig: Take 1 tablet (20 mg total) by mouth daily.    Dispense:  30 tablet    Refill:  5    Order Specific Question:   Supervising Provider    Answer:   Hoy Register [[id number]]  . ezetimibe (ZETIA) 10 MG tablet    Sig: Take 1 tablet (10 mg total) by mouth daily.    Dispense:  30 tablet    Refill:  5    Order  Specific Question:   Supervising Provider    Answer:   Hoy Register [[id number]]  . atorvastatin (LIPITOR) 40 MG tablet    Sig: TAKE 1 TABLET BY MOUTH ONCE DAILY  "OV NEEDED FOR REFILLS"    Dispense:  30 tablet    Refill:  11    Order Specific Question:   Supervising Provider    Answer:   Hoy Register [[id number]]  . amLODipine (NORVASC) 10 MG tablet    Sig: Take 1 tablet (10 mg total) by mouth daily.    Dispense:  90 tablet    Refill:  1    Order Specific Question:   Supervising Provider  Answer:   Hoy Register [[id number]]  . clonazePAM (KLONOPIN) 0.5 MG tablet    Sig: Take 1 tablet (0.5 mg total) by mouth daily as needed for anxiety.    Dispense:  30 tablet    Refill:  0    Order Specific Question:   Supervising Provider    Answer:   Hoy Register [[id number]]    Follow-up: Return in about 8 weeks (around 03/19/2018) for Anxiety.   Loletta Specter PA

## 2018-01-22 NOTE — Patient Instructions (Signed)

## 2018-01-23 ENCOUNTER — Telehealth (INDEPENDENT_AMBULATORY_CARE_PROVIDER_SITE_OTHER): Payer: Self-pay

## 2018-01-23 LAB — COMPREHENSIVE METABOLIC PANEL
A/G RATIO: 1.5 (ref 1.2–2.2)
ALT: 18 IU/L (ref 0–32)
AST: 26 IU/L (ref 0–40)
Albumin: 4.5 g/dL (ref 3.5–4.7)
Alkaline Phosphatase: 117 IU/L (ref 39–117)
BILIRUBIN TOTAL: 0.6 mg/dL (ref 0.0–1.2)
BUN/Creatinine Ratio: 12 (ref 12–28)
BUN: 15 mg/dL (ref 8–27)
CHLORIDE: 103 mmol/L (ref 96–106)
CO2: 22 mmol/L (ref 20–29)
Calcium: 10 mg/dL (ref 8.7–10.3)
Creatinine, Ser: 1.22 mg/dL — ABNORMAL HIGH (ref 0.57–1.00)
GFR calc Af Amer: 48 mL/min/{1.73_m2} — ABNORMAL LOW (ref >59)
GFR, EST NON AFRICAN AMERICAN: 41 mL/min/{1.73_m2} — AB (ref >59)
GLUCOSE: 104 mg/dL — AB (ref 65–99)
Globulin, Total: 3.1 g/dL (ref 1.5–4.5)
POTASSIUM: 4.5 mmol/L (ref 3.5–5.2)
Sodium: 144 mmol/L (ref 134–144)
Total Protein: 7.6 g/dL (ref 6.0–8.5)

## 2018-01-23 LAB — LIPID PANEL
CHOL/HDL RATIO: 2.5 ratio (ref 0.0–4.4)
Cholesterol, Total: 175 mg/dL (ref 100–199)
HDL: 70 mg/dL (ref >39)
LDL CALC: 93 mg/dL (ref 0–99)
TRIGLYCERIDES: 62 mg/dL (ref 0–149)
VLDL Cholesterol Cal: 12 mg/dL (ref 5–40)

## 2018-01-23 NOTE — Telephone Encounter (Signed)
-----   Message from Loletta Specteroger David Gomez, PA-C sent at 01/23/2018  8:36 AM EST ----- CKD stable. Cholesterol normal.

## 2018-01-23 NOTE — Telephone Encounter (Signed)
Patient is aware that CKD is stable and cholesterol is normal. Maryjean Mornempestt S Roberts, CMA

## 2018-01-24 LAB — URINE CULTURE

## 2018-01-31 ENCOUNTER — Telehealth (INDEPENDENT_AMBULATORY_CARE_PROVIDER_SITE_OTHER): Payer: Self-pay

## 2018-01-31 NOTE — Telephone Encounter (Signed)
Patient is aware that no predominant bacteria was found in urine. Finish antibiotics as directed. Patient stated she has finished abx. Leah Anderson, CMA

## 2018-01-31 NOTE — Telephone Encounter (Signed)
-----   Message from Loletta Specteroger David Gomez, PA-C sent at 01/29/2018 11:40 AM EST ----- No predominant bacteria found in urine. 10K to 25K bacterial colony units found. Finish abx as directed.

## 2018-03-29 ENCOUNTER — Other Ambulatory Visit: Payer: Self-pay | Admitting: Pharmacist

## 2018-03-29 DIAGNOSIS — I10 Essential (primary) hypertension: Secondary | ICD-10-CM

## 2018-03-29 MED ORDER — AMLODIPINE BESYLATE 10 MG PO TABS: 10.0000 mg | ORAL_TABLET | Freq: Every day | ORAL | 0 refills | Status: DC

## 2018-05-30 ENCOUNTER — Encounter: Payer: Self-pay | Admitting: Primary Care

## 2018-05-30 ENCOUNTER — Other Ambulatory Visit: Payer: Self-pay

## 2018-05-30 ENCOUNTER — Ambulatory Visit: Payer: Self-pay | Attending: Primary Care | Admitting: Primary Care

## 2018-05-30 DIAGNOSIS — R350 Frequency of micturition: Secondary | ICD-10-CM

## 2018-05-30 DIAGNOSIS — I10 Essential (primary) hypertension: Secondary | ICD-10-CM

## 2018-05-30 DIAGNOSIS — B308 Other viral conjunctivitis: Secondary | ICD-10-CM

## 2018-05-30 DIAGNOSIS — F411 Generalized anxiety disorder: Secondary | ICD-10-CM

## 2018-05-30 DIAGNOSIS — Z760 Encounter for issue of repeat prescription: Secondary | ICD-10-CM

## 2018-05-30 DIAGNOSIS — M5489 Other dorsalgia: Secondary | ICD-10-CM

## 2018-05-30 DIAGNOSIS — F419 Anxiety disorder, unspecified: Secondary | ICD-10-CM

## 2018-05-30 MED ORDER — MOXIFLOXACIN HCL 0.5 % OP SOLN: 1.0000 [drp] | Freq: Three times a day (TID) | OPHTHALMIC | 0 refills | Status: AC

## 2018-05-30 MED ORDER — ATORVASTATIN CALCIUM 40 MG PO TABS: ORAL_TABLET | ORAL | 11 refills | Status: DC

## 2018-05-30 MED ORDER — EZETIMIBE 10 MG PO TABS: 10.0000 mg | ORAL_TABLET | Freq: Every day | ORAL | 5 refills | Status: DC

## 2018-05-30 MED ORDER — ESCITALOPRAM OXALATE 20 MG PO TABS: 20.0000 mg | ORAL_TABLET | Freq: Every day | ORAL | 5 refills | Status: DC

## 2018-05-30 MED ORDER — CLONAZEPAM 0.5 MG PO TABS: 0.5000 mg | ORAL_TABLET | Freq: Every day | ORAL | 0 refills | Status: DC | PRN

## 2018-05-30 MED ORDER — AMLODIPINE BESYLATE 10 MG PO TABS: 10.0000 mg | ORAL_TABLET | Freq: Every day | ORAL | 3 refills | Status: DC

## 2018-05-30 NOTE — Progress Notes (Signed)
Established Patient Office Visit  Subjective:  Patient ID: Leah Anderson, female    DOB: Oct 28, 1933  Age: 85 y.o. MRN: [id number]  CC: No chief complaint on file.   HPI Leah Anderson was visited via  tele health . She is not feeling very well. She c/o urinary frequency but denies burning or pain .   Past Medical History:  Diagnosis Date  . Arthritis   . Hyperlipidemia   . Hypertension     Past Surgical History:  Procedure Laterality Date  . APPENDECTOMY    . CHOLECYSTECTOMY    . THYROIDECTOMY, PARTIAL      Family History  Problem Relation Age of Onset  . Cancer Mother   . Cancer Sister     Social History   Socioeconomic History  . Marital status: Single    Spouse name: Not on file  . Number of children: Not on file  . Years of education: Not on file  . Highest education level: Not on file  Occupational History  . Not on file  Social Needs  . Financial resource strain: Not on file  . Food insecurity:    Worry: Not on file    Inability: Not on file  . Transportation needs:    Medical: Not on file    Non-medical: Not on file  Tobacco Use  . Smoking status: Former Smoker    Types: Cigarettes  . Smokeless tobacco: Never Used  Substance and Sexual Activity  . Alcohol use: No  . Drug use: No  . Sexual activity: Not Currently  Lifestyle  . Physical activity:    Days per week: Not on file    Minutes per session: Not on file  . Stress: Not on file  Relationships  . Social connections:    Talks on phone: Not on file    Gets together: Not on file    Attends religious service: Not on file    Active member of club or organization: Not on file    Attends meetings of clubs or organizations: Not on file    Relationship status: Not on file  . Intimate partner violence:    Fear of current or ex partner: Not on file    Emotionally abused: Not on file    Physically abused: Not on file    Forced sexual activity: Not on file  Other Topics Concern  . Not on file   Social History Narrative  . Not on file    Outpatient Medications Prior to Visit  Medication Sig Dispense Refill  . amLODipine (NORVASC) 10 MG tablet Take 1 tablet (10 mg total) by mouth daily. Needs office visit for refills. 30 tablet 0  . atorvastatin (LIPITOR) 40 MG tablet TAKE 1 TABLET BY MOUTH ONCE DAILY  "OV NEEDED FOR REFILLS" 30 tablet 11  . clonazePAM (KLONOPIN) 0.5 MG tablet Take 1 tablet (0.5 mg total) by mouth daily as needed for anxiety. 30 tablet 0  . escitalopram (LEXAPRO) 20 MG tablet Take 1 tablet (20 mg total) by mouth daily. 30 tablet 5  . ezetimibe (ZETIA) 10 MG tablet Take 1 tablet (10 mg total) by mouth daily. 30 tablet 5  . zolpidem (AMBIEN) 5 MG tablet Take 1 tablet (5 mg total) by mouth at bedtime as needed for sleep. (Patient not taking: Reported on 05/30/2018) 30 tablet 1   No facility-administered medications prior to visit.     No Known Allergies  ROS Review of Systems  Constitutional: Negative.   Eyes: Positive  for discharge, redness and itching.  Gastrointestinal: Negative.   Endocrine: Negative.   Genitourinary: Positive for dysuria, frequency and urgency.  Musculoskeletal: Positive for back pain.  Skin: Negative.   Allergic/Immunologic: Negative.   Neurological: Negative.   Hematological: Negative.   Psychiatric/Behavioral: Negative.       Objective:    Physical Exam  There were no vitals taken for this visit. Wt Readings from Last 3 Encounters:  01/22/18 209 lb (94.8 kg)  09/12/17 207 lb (93.9 kg)  08/15/17 201 lb 9.6 oz (91.4 kg)     Health Maintenance Due  Topic Date Due  . DEXA SCAN  12/05/2000    There are no preventive care reminders to display for this patient.  Lab Results  Component Value Date   TSH 1.990 05/23/2017   Lab Results  Component Value Date   WBC 5.0 08/15/2017   HGB 12.3 08/15/2017   HCT 37.6 08/15/2017   MCV 87 08/15/2017   PLT 256 08/15/2017   Lab Results  Component Value Date   NA 144 01/22/2018    K 4.5 01/22/2018   CO2 22 01/22/2018   GLUCOSE 104 (H) 01/22/2018   BUN 15 01/22/2018   CREATININE 1.22 (H) 01/22/2018   BILITOT 0.6 01/22/2018   ALKPHOS 117 01/22/2018   AST 26 01/22/2018   ALT 18 01/22/2018   PROT 7.6 01/22/2018   ALBUMIN 4.5 01/22/2018   CALCIUM 10.0 01/22/2018   ANIONGAP 11 08/10/2017   Lab Results  Component Value Date   CHOL 175 01/22/2018   Lab Results  Component Value Date   HDL 70 01/22/2018   Lab Results  Component Value Date   LDLCALC 93 01/22/2018   Lab Results  Component Value Date   TRIG 62 01/22/2018   Lab Results  Component Value Date   CHOLHDL 2.5 01/22/2018   Lab Results  Component Value Date   HGBA1C 5.5 05/23/2017      Assessment & Plan:   Problem List Items Addressed This Visit    Anxiety - Primary   Relevant Medications   escitalopram (LEXAPRO) 20 MG tablet    Other Visit Diagnoses    Hypertension, unspecified type       Relevant Medications   amLODipine (NORVASC) 10 MG tablet   atorvastatin (LIPITOR) 40 MG tablet   ezetimibe (ZETIA) 10 MG tablet   Medication refill       Relevant Medications   atorvastatin (LIPITOR) 40 MG tablet   ezetimibe (ZETIA) 10 MG tablet   GAD (generalized anxiety disorder)       Relevant Medications   escitalopram (LEXAPRO) 20 MG tablet   Chronic viral conjunctivitis of right eye         Diagnoses and all orders for this visit:  Anxiety During this trouble times staying in house and no socialization . Refilled clonazepam .5mg  prn  Hypertension, unspecified type -     amLODipine (NORVASC) 10 MG tablet; Take 1 tablet (10 mg total) by mouth daily.   Medication refill -     atorvastatin (LIPITOR) 40 MG tablet; TAKE 1 TABLET BY MOUTH ONCE DAILY  "OV NEEDED FOR REFILLS" -     ezetimibe (ZETIA) 10 MG tablet; Take 1 tablet (10 mg total) by mouth daily.  GAD (generalized anxiety disorder) -     escitalopram (LEXAPRO) 20 MG tablet; Take 1 tablet (20 mg total) by mouth daily.   Chronic viral conjunctivitis of right eye Crusted when awaken in AM red and itchy per patient .  Tx empirically   Other orders -     clonazePAM (KLONOPIN) 0.5 MG tablet; Take 1 tablet (0.5 mg total) by mouth daily as needed for anxiety. -     moxifloxacin (VIGAMOX) 0.5 % ophthalmic solution; Place 1 drop into both eyes 3 (three) times daily for 7 days.   No orders of the defined types were placed in this encounter.   Follow-up: Return in about 3 months (around 08/29/2018).    Grayce Sessions, NP

## 2018-05-30 NOTE — Patient Instructions (Signed)
Allergic Conjunctivitis  A clear membrane (conjunctiva) covers the white part of your eye and the inner surface of your eyelid. Allergic conjunctivitis happens when this membrane has inflammation. This is caused by allergies. Common causes of allergic reactions (allergens)include:  · Outdoor allergens, such as:  ? Pollen.  ? Grass and weeds.  ? Mold spores.  · Indoor allergens, such as:  ? Dust.  ? Smoke.  ? Mold.  ? Pet dander.  ? Animal hair.  This condition can make your eye red or pink. It can also make your eye feel itchy. This condition cannot be spread from one person to another person (is not contagious).  Follow these instructions at home:  · Try not to be around things that you are allergic to.  · Take or apply over-the-counter and prescription medicines only as told by your doctor. These include any eye drops.  · Place a cool, clean washcloth on your eye for 10-20 minutes. Do this 3-4 times a day.  · Do not touch or rub your eyes.  · Do not wear contact lenses until the inflammation is gone. Wear glasses instead.  · Do not wear eye makeup until the inflammation is gone.  · Keep all follow-up visits as told by your doctor. This is important.  Contact a doctor if:  · Your symptoms get worse.  · Your symptoms do not get better with treatment.  · You have mild eye pain.  · You are sensitive to light,  · You have spots or blisters on your eyes.  · You have pus coming from your eye.  · You have a fever.  Get help right away if:  · You have redness, swelling, or other symptoms in only one eye.  · Your vision is blurry.  · You have vision changes.  · You have very bad eye pain.  Summary  · Allergic conjunctivitis is caused by allergies. It can make your eye red or pink, and it can make your eye feel itchy.  · This condition cannot be spread from one person to another person (is not contagious).  · Try not to be around things that you are allergic to.  · Take or apply over-the-counter and prescription medicines  only as told by your doctor. These include any eye drops.  · Contact your doctor if your symptoms get worse or they do not get better with treatment.  This information is not intended to replace advice given to you by your health care provider. Make sure you discuss any questions you have with your health care provider.  Document Released: 08/03/2009 Document Revised: 10/08/2015 Document Reviewed: 10/08/2015  Elsevier Interactive Patient Education © 2019 [employer] Inc[employer]

## 2018-05-30 NOTE — Progress Notes (Signed)
2 weeks she states she has urinary frequency and vaginal irritation.  Denies dysuria, back pain, fever,   Has a bump on her head.  She has white discharge with bloody show. Sometimes it's green Has facial swelling around her yes. Sx's for 3 days.

## 2018-08-13 ENCOUNTER — Other Ambulatory Visit: Payer: Self-pay | Admitting: Pharmacist

## 2018-08-13 DIAGNOSIS — Z760 Encounter for issue of repeat prescription: Secondary | ICD-10-CM

## 2018-08-13 DIAGNOSIS — F411 Generalized anxiety disorder: Secondary | ICD-10-CM

## 2018-08-13 MED ORDER — EZETIMIBE 10 MG PO TABS: 10.0000 mg | ORAL_TABLET | Freq: Every day | ORAL | 0 refills | Status: DC

## 2018-08-13 NOTE — Progress Notes (Addendum)
Patient requesting refill of Klonopin - will defer to PCP. Last refill April # 30 . Needs f/u app

## 2018-08-16 ENCOUNTER — Other Ambulatory Visit (INDEPENDENT_AMBULATORY_CARE_PROVIDER_SITE_OTHER): Payer: Self-pay | Admitting: Primary Care

## 2018-08-16 MED ORDER — CLONAZEPAM 0.5 MG PO TABS: 0.5000 mg | ORAL_TABLET | Freq: Every day | ORAL | 0 refills | Status: DC | PRN

## 2018-10-03 ENCOUNTER — Ambulatory Visit (INDEPENDENT_AMBULATORY_CARE_PROVIDER_SITE_OTHER): Payer: Medicare Other | Admitting: Primary Care

## 2018-10-03 ENCOUNTER — Other Ambulatory Visit: Payer: Self-pay

## 2018-10-03 ENCOUNTER — Encounter (INDEPENDENT_AMBULATORY_CARE_PROVIDER_SITE_OTHER): Payer: Self-pay | Admitting: Primary Care

## 2018-10-03 VITALS — BP 123/71 | HR 73 | Temp 97.6°F | Ht 67.0 in | Wt 214.4 lb

## 2018-10-03 DIAGNOSIS — E7841 Elevated Lipoprotein(a): Secondary | ICD-10-CM | POA: Diagnosis not present

## 2018-10-03 DIAGNOSIS — F5105 Insomnia due to other mental disorder: Secondary | ICD-10-CM

## 2018-10-03 DIAGNOSIS — F411 Generalized anxiety disorder: Secondary | ICD-10-CM | POA: Diagnosis not present

## 2018-10-03 DIAGNOSIS — Z760 Encounter for issue of repeat prescription: Secondary | ICD-10-CM

## 2018-10-03 DIAGNOSIS — Z6835 Body mass index (BMI) 35.0-35.9, adult: Secondary | ICD-10-CM

## 2018-10-03 DIAGNOSIS — I10 Essential (primary) hypertension: Secondary | ICD-10-CM

## 2018-10-03 DIAGNOSIS — E6609 Other obesity due to excess calories: Secondary | ICD-10-CM

## 2018-10-03 DIAGNOSIS — F99 Mental disorder, not otherwise specified: Secondary | ICD-10-CM

## 2018-10-03 MED ORDER — HYDROXYZINE HCL 25 MG PO TABS: 25.0000 mg | ORAL_TABLET | Freq: Three times a day (TID) | ORAL | 1 refills | Status: DC | PRN

## 2018-10-03 MED ORDER — AMLODIPINE BESYLATE 10 MG PO TABS: 10.0000 mg | ORAL_TABLET | Freq: Every day | ORAL | 3 refills | Status: DC

## 2018-10-03 MED ORDER — ESCITALOPRAM OXALATE 20 MG PO TABS: 20.0000 mg | ORAL_TABLET | Freq: Every day | ORAL | 5 refills | Status: DC

## 2018-10-03 NOTE — Progress Notes (Signed)
Established Patient Office Visit  Subjective:  Patient ID: Leah Anderson, female    DOB: 28-Nov-1933  Age: 85 y.o. MRN: [id number]  CC:  Chief Complaint  Patient presents with  . Establish Care  . Medication Refill    HPI Leah Anderson presents for management of your blood pressure- she denies shortness of breath, headaches, chest pain or lower extremity edema.  Past Medical History:  Diagnosis Date  . Arthritis   . Hyperlipidemia   . Hypertension     Past Surgical History:  Procedure Laterality Date  . APPENDECTOMY    . CHOLECYSTECTOMY    . THYROIDECTOMY, PARTIAL      Family History  Problem Relation Age of Onset  . Cancer Mother   . Cancer Sister     Social History   Socioeconomic History  . Marital status: Single    Spouse name: Not on file  . Number of children: Not on file  . Years of education: Not on file  . Highest education level: Not on file  Occupational History  . Not on file  Social Needs  . Financial resource strain: Not on file  . Food insecurity    Worry: Not on file    Inability: Not on file  . Transportation needs    Medical: Not on file    Non-medical: Not on file  Tobacco Use  . Smoking status: Former Smoker    Types: Cigarettes  . Smokeless tobacco: Never Used  Substance and Sexual Activity  . Alcohol use: No  . Drug use: No  . Sexual activity: Not Currently  Lifestyle  . Physical activity    Days per week: Not on file    Minutes per session: Not on file  . Stress: Not on file  Relationships  . Social Herbalist on phone: Not on file    Gets together: Not on file    Attends religious service: Not on file    Active member of club or organization: Not on file    Attends meetings of clubs or organizations: Not on file    Relationship status: Not on file  . Intimate partner violence    Fear of current or ex partner: Not on file    Emotionally abused: Not on file    Physically abused: Not on file    Forced sexual  activity: Not on file  Other Topics Concern  . Not on file  Social History Narrative  . Not on file    Outpatient Medications Prior to Visit  Medication Sig Dispense Refill  . amLODipine (NORVASC) 10 MG tablet Take 1 tablet (10 mg total) by mouth daily. Needs office visit for refills. 30 tablet 3  . escitalopram (LEXAPRO) 20 MG tablet Take 1 tablet (20 mg total) by mouth daily. 30 tablet 5  . atorvastatin (LIPITOR) 40 MG tablet TAKE 1 TABLET BY MOUTH ONCE DAILY  "OV NEEDED FOR REFILLS" (Patient not taking: Reported on 10/03/2018) 30 tablet 11  . clonazePAM (KLONOPIN) 0.5 MG tablet Take 1 tablet (0.5 mg total) by mouth daily as needed for anxiety. (Patient not taking: Reported on 10/03/2018) 30 tablet 0  . ezetimibe (ZETIA) 10 MG tablet Take 1 tablet (10 mg total) by mouth daily. (Patient not taking: Reported on 10/03/2018) 90 tablet 0   No facility-administered medications prior to visit.     No Known Allergies  ROS Review of Systems  Genitourinary: Positive for dysuria and frequency.  Musculoskeletal:  Left knee pain -crepitus  Psychiatric/Behavioral: Positive for sleep disturbance.      Objective:    Physical Exam  Constitutional: She is oriented to person, place, and time. She appears well-developed and well-nourished.  Eyes: EOM are normal.  Neck: Neck supple.  Cardiovascular: Normal rate and regular rhythm.  Pulmonary/Chest: Effort normal and breath sounds normal.  Abdominal: Soft. Bowel sounds are normal. She exhibits distension.  Musculoskeletal: Normal range of motion.     Comments: Left knee stiff with ROM- crepitus   Neurological: She is oriented to person, place, and time.  Skin: Skin is warm and dry.  Psychiatric: She has a normal mood and affect.    BP 123/71 (BP Location: Left Arm, Patient Position: Sitting, Cuff Size: Large)   Pulse 73   Temp 97.6 F (36.4 C) (Tympanic)   Ht _0  (1.702 m)   Wt 214 lb 6.4 oz (97.3 kg)   SpO2 96%   BMI 33.58 kg/m   Wt Readings from Last 3 Encounters:  10/03/18 214 lb 6.4 oz (97.3 kg)  01/22/18 209 lb (94.8 kg)  09/12/17 207 lb (93.9 kg)     Health Maintenance Due  Topic Date Due  . DEXA SCAN  12/05/2000  . PNA vac Low Risk Adult (2 of 2 - PPSV23) 06/21/2018  . INFLUENZA VACCINE  09/28/2018    There are no preventive care reminders to display for this patient.  Lab Results  Component Value Date   TSH 1.990 05/23/2017   Lab Results  Component Value Date   WBC 5.0 08/15/2017   HGB 12.3 08/15/2017   HCT 37.6 08/15/2017   MCV 87 08/15/2017   PLT 256 08/15/2017   Lab Results  Component Value Date   NA 144 01/22/2018   K 4.5 01/22/2018   CO2 22 01/22/2018   GLUCOSE 104 (H) 01/22/2018   BUN 15 01/22/2018   CREATININE 1.22 (H) 01/22/2018   BILITOT 0.6 01/22/2018   ALKPHOS 117 01/22/2018   AST 26 01/22/2018   ALT 18 01/22/2018   PROT 7.6 01/22/2018   ALBUMIN 4.5 01/22/2018   CALCIUM 10.0 01/22/2018   ANIONGAP 11 08/10/2017   Lab Results  Component Value Date   CHOL 175 01/22/2018   Lab Results  Component Value Date   HDL 70 01/22/2018   Lab Results  Component Value Date   LDLCALC 93 01/22/2018   Lab Results  Component Value Date   TRIG 62 01/22/2018     Assessment & Plan:  Silena was seen today for establish care and medication refill.  Diagnoses and all orders for this visit:  Medication refill  Meds ordered this encounter  Medications  . escitalopram (LEXAPRO) 20 MG tablet    Sig: Take 1 tablet (20 mg total) by mouth daily.    Dispense:  30 tablet    Refill:  5  . amLODipine (NORVASC) 10 MG tablet    Sig: Take 1 tablet (10 mg total) by mouth daily. Needs office visit for refills.    Dispense:  30 tablet    Refill:  3  . hydrOXYzine (ATARAX/VISTARIL) 25 MG tablet    Sig: Take 1 tablet (25 mg total) by mouth 3 (three) times daily as needed for anxiety.    Dispense:  60 tablet    Refill:  1   Insomnia due to other mental disorder   Insomnia Onset: Pattern:    Difficulty going to sleep:    Frequent awakening:   Risk factors/sleep hygiene:  Stimulants:    Alcohol intake:    Reading, watching TV, eating @ bedtime:  -     Ambulatory referral to Psychiatry  Elevated lipoprotein(a)  Healthy lifestyle diet of fruits vegetables Foods high in cholesterol or liver, fatty meats,cheese, butter avocados, nuts and seeds, chocolate and fried foods. -     Lipid Panel  Hypertension, unspecified type Counseled on blood pressure goal of less than 130/80, low-sodium, DASH diet, medication compliance, 150 minutes of moderate intensity exercise per week. Discussed medication compliance, adverse effects. -     CBC with Differential -     CMP14+EGFR -     amLODipine (NORVASC) 10 MG tablet; Take 1 tablet (10 mg total) by mouth daily. Needs office visit for refills.  GAD (generalized anxiety disorder)  We discussed options for treatment of anxiety including therapy and/or medication.  Will check basic labs to ensure thyroid is in normal range and that no other metabolic issues are obvious.  Reviewed concept of anxiety as biochemical imbalance of neurotransmitters and rationale for treatment. Discussed potential risks, expected benefits, possible side effects of the medicine. We also discussed how to take it correctly and dosing instructions. If she has any significant side effects to the medicine, she is to stop it and call for advice.  Instructed patient to contact office or on-call physician promptly should condition worsen or any new symptoms appear.    She was agreeable with this plan.  -     escitalopram (LEXAPRO) 20 MG tablet; Take 1 tablet (20 mg total) by mouth daily.  Class 2 obesity due to excess calories without serious comorbidity with body mass index (BMI) of 35.0 to 35.9 in adult Obesity is 30-39 indicating an excess in caloric intake or underlining conditions. This may lead to other co-morbidities. Lifestyle  modifications of diet and exercise may reduce obesity.   Other orders -     hydrOXYzine (ATARAX/VISTARIL) 25 MG tablet; Take 1 tablet (25 mg total) by mouth 3 (three) times daily as needed for anxiety.   Follow-up: Return in about 4 weeks (around 10/31/2018) for HTN, started on hydroxyzine for anxiety .    Kerin Perna, NP

## 2018-10-03 NOTE — Patient Instructions (Signed)
Cholesterol Content in Foods Cholesterol is a waxy, fat-like substance that helps to carry fat in the blood. The body needs cholesterol in small amounts, but too much cholesterol can cause damage to the arteries and heart. Most people should eat less than 200 milligrams (mg) of cholesterol a day. Foods with cholesterol  Cholesterol is found in animal-based foods, such as meat, seafood, and dairy. Generally, low-fat dairy and lean meats have less cholesterol than full-fat dairy and fatty meats. The milligrams of cholesterol per serving (mg per serving) of common cholesterol-containing foods are listed below. Meat and other proteins  Egg - one large whole egg has 186 mg.  Veal shank - 4 oz has 141 mg.  Lean ground turkey (93% lean) - 4 oz has 118 mg.  Fat-trimmed lamb loin - 4 oz has 106 mg.  Lean ground beef (90% lean) - 4 oz has 100 mg.  Lobster - 3.5 oz has 90 mg.  Pork loin chops - 4 oz has 86 mg.  Canned salmon - 3.5 oz has 83 mg.  Fat-trimmed beef top loin - 4 oz has 78 mg.  Frankfurter - 1 frank (3.5 oz) has 77 mg.  Crab - 3.5 oz has 71 mg.  Roasted chicken without skin, white meat - 4 oz has 66 mg.  Light bologna - 2 oz has 45 mg.  Deli-cut turkey - 2 oz has 31 mg.  Canned tuna - 3.5 oz has 31 mg.  Bacon - 1 oz has 29 mg.  Oysters and mussels (raw) - 3.5 oz has 25 mg.  Mackerel - 1 oz has 22 mg.  Trout - 1 oz has 20 mg.  Pork sausage - 1 link (1 oz) has 17 mg.  Salmon - 1 oz has 16 mg.  Tilapia - 1 oz has 14 mg. Dairy  Soft-serve ice cream -  cup (4 oz) has 103 mg.  Whole-milk yogurt - 1 cup (8 oz) has 29 mg.  Cheddar cheese - 1 oz has 28 mg.  American cheese - 1 oz has 28 mg.  Whole milk - 1 cup (8 oz) has 23 mg.  2% milk - 1 cup (8 oz) has 18 mg.  Cream cheese - 1 tablespoon (Tbsp) has 15 mg.  Cottage cheese -  cup (4 oz) has 14 mg.  Low-fat (1%) milk - 1 cup (8 oz) has 10 mg.  Sour cream - 1 Tbsp has 8.5 mg.  Low-fat yogurt - 1 cup  (8 oz) has 8 mg.  Nonfat Greek yogurt - 1 cup (8 oz) has 7 mg.  Half-and-half cream - 1 Tbsp has 5 mg. Fats and oils  Cod liver oil - 1 tablespoon (Tbsp) has 82 mg.  Butter - 1 Tbsp has 15 mg.  Lard - 1 Tbsp has 14 mg.  Bacon grease - 1 Tbsp has 14 mg.  Mayonnaise - 1 Tbsp has 5-10 mg.  Margarine - 1 Tbsp has 3-10 mg. Exact amounts of cholesterol in these foods may vary depending on specific ingredients and brands. Foods without cholesterol Most plant-based foods do not have cholesterol unless you combine them with a food that has cholesterol. Foods without cholesterol include:  Grains and cereals.  Vegetables.  Fruits.  Vegetable oils, such as olive, canola, and sunflower oil.  Legumes, such as peas, beans, and lentils.  Nuts and seeds.  Egg whites. Summary  The body needs cholesterol in small amounts, but too much cholesterol can cause damage to the arteries and heart.    Most people should eat less than 200 milligrams (mg) of cholesterol a day. This information is not intended to replace advice given to you by your health care provider. Make sure you discuss any questions you have with your health care provider. Document Released: 10/10/2016 Document Revised: 01/26/2017 Document Reviewed: 10/10/2016 Elsevier Patient Education  2020 Elsevier Inc.  

## 2018-10-04 ENCOUNTER — Telehealth (INDEPENDENT_AMBULATORY_CARE_PROVIDER_SITE_OTHER): Payer: Self-pay

## 2018-10-04 LAB — CMP14+EGFR
ALT: 9 IU/L (ref 0–32)
AST: 16 IU/L (ref 0–40)
Albumin/Globulin Ratio: 1.1 — ABNORMAL LOW (ref 1.2–2.2)
Albumin: 4.2 g/dL (ref 3.6–4.6)
Alkaline Phosphatase: 116 IU/L (ref 39–117)
BUN/Creatinine Ratio: 14 (ref 12–28)
BUN: 18 mg/dL (ref 8–27)
Bilirubin Total: 0.4 mg/dL (ref 0.0–1.2)
CO2: 22 mmol/L (ref 20–29)
Calcium: 10 mg/dL (ref 8.7–10.3)
Chloride: 101 mmol/L (ref 96–106)
Creatinine, Ser: 1.27 mg/dL — ABNORMAL HIGH (ref 0.57–1.00)
GFR calc Af Amer: 45 mL/min/{1.73_m2} — ABNORMAL LOW (ref >59)
GFR calc non Af Amer: 39 mL/min/{1.73_m2} — ABNORMAL LOW (ref >59)
Globulin, Total: 3.7 g/dL (ref 1.5–4.5)
Glucose: 93 mg/dL (ref 65–99)
Potassium: 4.9 mmol/L (ref 3.5–5.2)
Sodium: 140 mmol/L (ref 134–144)
Total Protein: 7.9 g/dL (ref 6.0–8.5)

## 2018-10-04 LAB — CBC WITH DIFFERENTIAL/PLATELET
Basophils Absolute: 0 10*3/uL (ref 0.0–0.2)
Basos: 1 %
EOS (ABSOLUTE): 0.1 10*3/uL (ref 0.0–0.4)
Eos: 1 %
Hematocrit: 43.2 % (ref 34.0–46.6)
Hemoglobin: 14 g/dL (ref 11.1–15.9)
Immature Grans (Abs): 0 10*3/uL (ref 0.0–0.1)
Immature Granulocytes: 0 %
Lymphocytes Absolute: 2 10*3/uL (ref 0.7–3.1)
Lymphs: 38 %
MCH: 29.4 pg (ref 26.6–33.0)
MCHC: 32.4 g/dL (ref 31.5–35.7)
MCV: 91 fL (ref 79–97)
Monocytes Absolute: 0.5 10*3/uL (ref 0.1–0.9)
Monocytes: 10 %
Neutrophils Absolute: 2.6 10*3/uL (ref 1.4–7.0)
Neutrophils: 50 %
Platelets: 248 10*3/uL (ref 150–450)
RBC: 4.77 x10E6/uL (ref 3.77–5.28)
RDW: 13.6 % (ref 11.7–15.4)
WBC: 5.1 10*3/uL (ref 3.4–10.8)

## 2018-10-04 LAB — LIPID PANEL
Chol/HDL Ratio: 3.7 ratio (ref 0.0–4.4)
Cholesterol, Total: 230 mg/dL — ABNORMAL HIGH (ref 100–199)
HDL: 62 mg/dL (ref >39)
LDL Calculated: 151 mg/dL — ABNORMAL HIGH (ref 0–99)
Triglycerides: 85 mg/dL (ref 0–149)
VLDL Cholesterol Cal: 17 mg/dL (ref 5–40)

## 2018-10-04 NOTE — Telephone Encounter (Signed)
-----   Message from Kerin Perna, NP sent at 10/04/2018 10:27 AM EDT ----- I have reviewed all labs and they are normal except elevated cholesterol. Foods high in cholesterol or liver, fatty meats,cheese, butter avocados, nuts and seeds, chocolate and fried foods. Continue to take atorvastin 40mg  qhs    Please notify patient of the results. Have them schedule appointment if there are any other concerns or questions. Thank you.

## 2018-10-04 NOTE — Telephone Encounter (Signed)
Patient is aware of results and advice per PCP. Patient advised to continue taking atorvastatin 40 mg every night. She will begin taking them today. Nat Christen, CMA

## 2018-11-05 ENCOUNTER — Ambulatory Visit (INDEPENDENT_AMBULATORY_CARE_PROVIDER_SITE_OTHER): Payer: Medicare Other | Admitting: Primary Care

## 2018-11-15 ENCOUNTER — Ambulatory Visit (INDEPENDENT_AMBULATORY_CARE_PROVIDER_SITE_OTHER): Payer: Medicare Other | Admitting: Primary Care

## 2018-11-15 ENCOUNTER — Other Ambulatory Visit: Payer: Self-pay

## 2018-11-15 ENCOUNTER — Encounter (INDEPENDENT_AMBULATORY_CARE_PROVIDER_SITE_OTHER): Payer: Self-pay | Admitting: Primary Care

## 2018-11-15 VITALS — BP 142/67 | HR 63 | Temp 98.2°F | Ht 67.0 in | Wt 213.0 lb

## 2018-11-15 DIAGNOSIS — F411 Generalized anxiety disorder: Secondary | ICD-10-CM

## 2018-11-15 DIAGNOSIS — I10 Essential (primary) hypertension: Secondary | ICD-10-CM

## 2018-11-15 DIAGNOSIS — Z780 Asymptomatic menopausal state: Secondary | ICD-10-CM

## 2018-11-15 DIAGNOSIS — Z760 Encounter for issue of repeat prescription: Secondary | ICD-10-CM | POA: Diagnosis not present

## 2018-11-15 MED ORDER — HYDROCHLOROTHIAZIDE 25 MG PO TABS: 25.0000 mg | ORAL_TABLET | Freq: Every day | ORAL | 3 refills | Status: DC

## 2018-11-15 MED ORDER — AMLODIPINE BESYLATE 10 MG PO TABS: 10.0000 mg | ORAL_TABLET | Freq: Every day | ORAL | 3 refills | Status: DC

## 2018-11-15 MED ORDER — CLONAZEPAM 0.5 MG PO TABS: 0.5000 mg | ORAL_TABLET | Freq: Every day | ORAL | 0 refills | Status: DC | PRN

## 2018-11-15 MED ORDER — ESCITALOPRAM OXALATE 20 MG PO TABS: 20.0000 mg | ORAL_TABLET | Freq: Every day | ORAL | 3 refills | Status: DC

## 2018-11-15 NOTE — Progress Notes (Signed)
Established Patient Office Visit  Subjective:  Patient ID: Leah Anderson, female    DOB: [birth year]  Age: 85 y.o. MRN: [id number]  CC:  Chief Complaint  Patient presents with  . Hypertension  . Medication Refill    all medications     HPI Leah Anderson presents for of hypertension -she denies shortness of breath, headaches, chest pain or lower extremity edema and she is expressing insomnia and anxiety requesting that "pasm" medication really helps with both.  Past Medical History:  Diagnosis Date  . Arthritis   . Hyperlipidemia   . Hypertension     Past Surgical History:  Procedure Laterality Date  . APPENDECTOMY    . CHOLECYSTECTOMY    . THYROIDECTOMY, PARTIAL      Family History  Problem Relation Age of Onset  . Cancer Mother   . Cancer Sister     Social History   Socioeconomic History  . Marital status: Single    Spouse name: Not on file  . Number of children: Not on file  . Years of education: Not on file  . Highest education level: Not on file  Occupational History  . Not on file  Social Needs  . Financial resource strain: Not on file  . Food insecurity    Worry: Not on file    Inability: Not on file  . Transportation needs    Medical: Not on file    Non-medical: Not on file  Tobacco Use  . Smoking status: Former Smoker    Types: Cigarettes  . Smokeless tobacco: Never Used  Substance and Sexual Activity  . Alcohol use: No  . Drug use: No  . Sexual activity: Not Currently  Lifestyle  . Physical activity    Days per week: Not on file    Minutes per session: Not on file  . Stress: Not on file  Relationships  . Social Herbalist on phone: Not on file    Gets together: Not on file    Attends religious service: Not on file    Active member of club or organization: Not on file    Attends meetings of clubs or organizations: Not on file    Relationship status: Not on file  . Intimate partner violence    Fear of current or ex partner:  Not on file    Emotionally abused: Not on file    Physically abused: Not on file    Forced sexual activity: Not on file  Other Topics Concern  . Not on file  Social History Narrative  . Not on file    Outpatient Medications Prior to Visit  Medication Sig Dispense Refill  . atorvastatin (LIPITOR) 40 MG tablet TAKE 1 TABLET BY MOUTH ONCE DAILY  "OV NEEDED FOR REFILLS" 30 tablet 11  . ezetimibe (ZETIA) 10 MG tablet Take 1 tablet (10 mg total) by mouth daily. 90 tablet 0  . hydrOXYzine (ATARAX/VISTARIL) 25 MG tablet Take 1 tablet (25 mg total) by mouth 3 (three) times daily as needed for anxiety. 60 tablet 1  . amLODipine (NORVASC) 10 MG tablet Take 1 tablet (10 mg total) by mouth daily. Needs office visit for refills. 30 tablet 3  . escitalopram (LEXAPRO) 20 MG tablet Take 1 tablet (20 mg total) by mouth daily. 30 tablet 5  . clonazePAM (KLONOPIN) 0.5 MG tablet Take 1 tablet (0.5 mg total) by mouth daily as needed for anxiety. (Patient not taking: Reported on 10/03/2018) 30 tablet 0  No facility-administered medications prior to visit.     No Known Allergies  ROS Review of Systems  Psychiatric/Behavioral: The patient is nervous/anxious.       Objective:    Physical Exam  Constitutional: She is oriented to person, place, and time. She appears well-developed and well-nourished.  HENT:  Head: Normocephalic and atraumatic.  Neck: Normal range of motion. Neck supple.  Cardiovascular: Normal rate and regular rhythm.  Pulmonary/Chest: Effort normal and breath sounds normal.  Abdominal: Soft. Bowel sounds are normal. She exhibits distension.  Musculoskeletal: Normal range of motion.  Neurological: She is oriented to person, place, and time.  Skin: Skin is warm.  Psychiatric: She has a normal mood and affect.    BP (!) 142/67 (BP Location: Right Arm, Patient Position: Sitting, Cuff Size: Large)   Pulse 63   Temp 98.2 F (36.8 C) (Oral)   Ht 5\' 7"  (1.702 m)   Wt 213 lb (96.6 kg)    SpO2 97%   BMI 33.36 kg/m  Wt Readings from Last 3 Encounters:  11/15/18 213 lb (96.6 kg)  10/03/18 214 lb 6.4 oz (97.3 kg)  01/22/18 209 lb (94.8 kg)     Health Maintenance Due  Topic Date Due  . PNA vac Low Risk Adult (2 of 2 - PPSV23) 06/21/2018    There are no preventive care reminders to display for this patient.  Lab Results  Component Value Date   TSH 1.990 05/23/2017   Lab Results  Component Value Date   WBC 5.1 10/03/2018   HGB 14.0 10/03/2018   HCT 43.2 10/03/2018   MCV 91 10/03/2018   PLT 248 10/03/2018   Lab Results  Component Value Date   NA 140 10/03/2018   K 4.9 10/03/2018   CO2 22 10/03/2018   GLUCOSE 93 10/03/2018   BUN 18 10/03/2018   CREATININE 1.27 (H) 10/03/2018   BILITOT 0.4 10/03/2018   ALKPHOS 116 10/03/2018   AST 16 10/03/2018   ALT 9 10/03/2018   PROT 7.9 10/03/2018   ALBUMIN 4.2 10/03/2018   CALCIUM 10.0 10/03/2018   ANIONGAP 11 08/10/2017   Lab Results  Component Value Date   CHOL 230 (H) 10/03/2018   Lab Results  Component Value Date   HDL 62 10/03/2018   Lab Results  Component Value Date   LDLCALC 151 (H) 10/03/2018   Lab Results  Component Value Date   TRIG 85 10/03/2018   Lab Results  Component Value Date   CHOLHDL 3.7 10/03/2018   Lab Results  Component Value Date   HGBA1C 5.5 05/23/2017      Assessment & Plan:   Sihaam was seen today for hypertension and medication refill.  Diagnoses and all orders for this visit:  Post-menopause Recommended  -     HM DEXA SCAN  GAD (generalized anxiety disorder)  We discussed options for treatment of anxiety including therapy and/or medication.  Will check basic labs to ensure thyroid is in normal range and that no other metabolic issues are obvious.  Reviewed concept of anxiety as biochemical imbalance of neurotransmitters and rationale for treatment. Discussed potential risks, expected benefits, possible side effects of the medicine. We also discussed how to  take it correctly and dosing instructions. If she has any significant side effects to the medicine, she is to stop it and call for advice.  Instructed patient to contact office or on-call physician promptly should condition worsen or any new symptoms appear.    She was agreeable with this  plan.  -     escitalopram (LEXAPRO) 20 MG tablet; Take 1 tablet (20 mg total) by mouth daily.  Hypertension, unspecified type Counseled on blood pressure goal of less than 130/80, low-sodium, DASH diet, medication compliance, 150 minutes of moderate intensity exercise per week. Discussed medication compliance, adverse effects. -     amLODipine (NORVASC) 10 MG tablet; Take 1 tablet (10 mg total) by mouth daily. Needs office visit for refills. -     hydrochlorothiazide (HYDRODIURIL) 25 MG tablet; Take 1 tablet (25 mg total) by mouth daily.  Other orders -     clonazePAM (KLONOPIN) 0.5 MG tablet; Take 1 tablet (0.5 mg total) by mouth daily as needed for anxiety.    Meds ordered this encounter  Medications  . escitalopram (LEXAPRO) 20 MG tablet    Sig: Take 1 tablet (20 mg total) by mouth daily.    Dispense:  30 tablet    Refill:  3  . amLODipine (NORVASC) 10 MG tablet    Sig: Take 1 tablet (10 mg total) by mouth daily. Needs office visit for refills.    Dispense:  30 tablet    Refill:  3  . clonazePAM (KLONOPIN) 0.5 MG tablet    Sig: Take 1 tablet (0.5 mg total) by mouth daily as needed for anxiety.    Dispense:  30 tablet    Refill:  0  . hydrochlorothiazide (HYDRODIURIL) 25 MG tablet    Sig: Take 1 tablet (25 mg total) by mouth daily.    Dispense:  90 tablet    Refill:  3    Follow-up: Return in about 6 weeks (around 12/27/2018) for Bp in person  and anxiety schedule on Tues to see Jasmine .    Grayce SessionsMichelle P Omran Keelin, NP

## 2018-11-15 NOTE — Patient Instructions (Signed)

## 2018-12-23 ENCOUNTER — Other Ambulatory Visit (INDEPENDENT_AMBULATORY_CARE_PROVIDER_SITE_OTHER): Payer: Self-pay | Admitting: Primary Care

## 2018-12-24 ENCOUNTER — Ambulatory Visit (INDEPENDENT_AMBULATORY_CARE_PROVIDER_SITE_OTHER): Payer: Medicare Other | Admitting: Primary Care

## 2019-01-03 ENCOUNTER — Other Ambulatory Visit: Payer: Self-pay

## 2019-01-03 ENCOUNTER — Ambulatory Visit (INDEPENDENT_AMBULATORY_CARE_PROVIDER_SITE_OTHER): Payer: Medicare Other | Admitting: Primary Care

## 2019-01-03 ENCOUNTER — Encounter (INDEPENDENT_AMBULATORY_CARE_PROVIDER_SITE_OTHER): Payer: Self-pay | Admitting: Primary Care

## 2019-01-03 VITALS — BP 132/55 | HR 71 | Temp 97.1°F | Ht 67.0 in | Wt 211.2 lb

## 2019-01-03 DIAGNOSIS — Z760 Encounter for issue of repeat prescription: Secondary | ICD-10-CM | POA: Diagnosis not present

## 2019-01-03 DIAGNOSIS — F411 Generalized anxiety disorder: Secondary | ICD-10-CM

## 2019-01-03 DIAGNOSIS — I10 Essential (primary) hypertension: Secondary | ICD-10-CM | POA: Diagnosis not present

## 2019-01-03 DIAGNOSIS — E7841 Elevated Lipoprotein(a): Secondary | ICD-10-CM

## 2019-01-03 DIAGNOSIS — E6609 Other obesity due to excess calories: Secondary | ICD-10-CM

## 2019-01-03 DIAGNOSIS — Z6835 Body mass index (BMI) 35.0-35.9, adult: Secondary | ICD-10-CM

## 2019-01-03 MED ORDER — EZETIMIBE 10 MG PO TABS: 10.0000 mg | ORAL_TABLET | Freq: Every day | ORAL | 0 refills | Status: DC

## 2019-01-03 MED ORDER — HYDROXYZINE HCL 25 MG PO TABS: 25.0000 mg | ORAL_TABLET | Freq: Three times a day (TID) | ORAL | 3 refills | Status: DC | PRN

## 2019-01-03 MED ORDER — CLONAZEPAM 0.5 MG PO TABS: 0.5000 mg | ORAL_TABLET | Freq: Every day | ORAL | 0 refills | Status: DC | PRN

## 2019-01-03 NOTE — Patient Instructions (Signed)

## 2019-01-03 NOTE — Progress Notes (Signed)
Established Patient Office Visit  Subjective:  Patient ID: Leah Anderson, female    DOB: [birth year]  Age: 85 y.o. MRN: [id number]  CC: No chief complaint on file.   HPI Geneen Dieter presents for annual physical.  Past Medical History:  Diagnosis Date  . Arthritis   . Hyperlipidemia   . Hypertension     Past Surgical History:  Procedure Laterality Date  . APPENDECTOMY    . CHOLECYSTECTOMY    . THYROIDECTOMY, PARTIAL      Family History  Problem Relation Age of Onset  . Cancer Mother   . Cancer Sister     Social History   Socioeconomic History  . Marital status: Single    Spouse name: Not on file  . Number of children: Not on file  . Years of education: Not on file  . Highest education level: Not on file  Occupational History  . Not on file  Social Needs  . Financial resource strain: Not on file  . Food insecurity    Worry: Not on file    Inability: Not on file  . Transportation needs    Medical: Not on file    Non-medical: Not on file  Tobacco Use  . Smoking status: Former Smoker    Types: Cigarettes  . Smokeless tobacco: Never Used  Substance and Sexual Activity  . Alcohol use: No  . Drug use: No  . Sexual activity: Not Currently  Lifestyle  . Physical activity    Days per week: Not on file    Minutes per session: Not on file  . Stress: Not on file  Relationships  . Social Herbalist on phone: Not on file    Gets together: Not on file    Attends religious service: Not on file    Active member of club or organization: Not on file    Attends meetings of clubs or organizations: Not on file    Relationship status: Not on file  . Intimate partner violence    Fear of current or ex partner: Not on file    Emotionally abused: Not on file    Physically abused: Not on file    Forced sexual activity: Not on file  Other Topics Concern  . Not on file  Social History Narrative  . Not on file    Outpatient Medications Prior to Visit   Medication Sig Dispense Refill  . amLODipine (NORVASC) 10 MG tablet Take 1 tablet (10 mg total) by mouth daily. Needs office visit for refills. 30 tablet 3  . atorvastatin (LIPITOR) 40 MG tablet TAKE 1 TABLET BY MOUTH ONCE DAILY  "OV NEEDED FOR REFILLS" 30 tablet 11  . escitalopram (LEXAPRO) 20 MG tablet Take 1 tablet (20 mg total) by mouth daily. 30 tablet 3  . hydrochlorothiazide (HYDRODIURIL) 25 MG tablet Take 1 tablet (25 mg total) by mouth daily. 90 tablet 3  . clonazePAM (KLONOPIN) 0.5 MG tablet Take 1 tablet (0.5 mg total) by mouth daily as needed for anxiety. 30 tablet 0  . ezetimibe (ZETIA) 10 MG tablet Take 1 tablet (10 mg total) by mouth daily. 90 tablet 0  . hydrOXYzine (ATARAX/VISTARIL) 25 MG tablet Take 1 tablet (25 mg total) by mouth 3 (three) times daily as needed for anxiety. 60 tablet 1   No facility-administered medications prior to visit.     No Known Allergies  ROS Review of Systems  Psychiatric/Behavioral: Positive for sleep disturbance. The patient is nervous/anxious.  All other systems reviewed and are negative.     Objective:    Physical Exam  Constitutional: She is oriented to person, place, and time. She appears well-developed and well-nourished.  obese  HENT:  Head: Normocephalic.  Eyes: Pupils are equal, round, and reactive to light. EOM are normal.  Neck: Normal range of motion. Neck supple.  Cardiovascular: Normal rate and regular rhythm.  Pulmonary/Chest: Effort normal and breath sounds normal.  Abdominal: Soft. Bowel sounds are normal. She exhibits distension.  Musculoskeletal: Normal range of motion.  Neurological: She is oriented to person, place, and time.  Skin: Skin is warm and dry.  Psychiatric: She has a normal mood and affect. Her behavior is normal. Thought content normal.  Vitals reviewed.   BP (!) 132/55 (BP Location: Left Arm, Patient Position: Sitting, Cuff Size: Large)   Pulse 71   Temp (!) 97.1 F (36.2 C) (Temporal)   Ht  5\' 7"  (1.702 m)   Wt 211 lb 3.2 oz (95.8 kg)   SpO2 97%   BMI 33.08 kg/m  Wt Readings from Last 3 Encounters:  01/03/19 211 lb 3.2 oz (95.8 kg)  11/15/18 213 lb (96.6 kg)  10/03/18 214 lb 6.4 oz (97.3 kg)     Health Maintenance Due  Topic Date Due  . TETANUS/TDAP  12/06/1954  . PNA vac Low Risk Adult (2 of 2 - PPSV23) 06/21/2018    There are no preventive care reminders to display for this patient.  Lab Results  Component Value Date   TSH 1.990 05/23/2017   Lab Results  Component Value Date   WBC 4.9 01/03/2019   HGB 14.2 01/03/2019   HCT 42.6 01/03/2019   MCV 88 01/03/2019   PLT 316 01/03/2019   Lab Results  Component Value Date   NA 139 01/03/2019   K 4.4 01/03/2019   CO2 25 01/03/2019   GLUCOSE 104 (H) 01/03/2019   BUN 14 01/03/2019   CREATININE 1.32 (H) 01/03/2019   BILITOT 0.5 01/03/2019   ALKPHOS 137 (H) 01/03/2019   AST 15 01/03/2019   ALT 11 01/03/2019   PROT 7.8 01/03/2019   ALBUMIN 4.6 01/03/2019   CALCIUM 10.2 01/03/2019   ANIONGAP 11 08/10/2017   Lab Results  Component Value Date   CHOL 169 01/03/2019   Lab Results  Component Value Date   HDL 62 01/03/2019   Lab Results  Component Value Date   LDLCALC 92 01/03/2019   Lab Results  Component Value Date   TRIG 78 01/03/2019   Lab Results  Component Value Date   CHOLHDL 2.7 01/03/2019   Lab Results  Component Value Date   HGBA1C 5.5 05/23/2017      Assessment & Plan:  Diagnoses and all orders for this visit:  Hypertension, unspecified type Well controlled considering age -     CBC with Differential -     Complete Metabolic Panel with GFR  Medication refill -     ezetimibe (ZETIA) 10 MG tablet; Take 1 tablet (10 mg total) by mouth daily.  Class 2 obesity due to excess calories without serious comorbidity with body mass index (BMI) of 35.0 to 35.9 in adult Excessive caloric intake patient is 85 pandemic enjoy what she likes to eat be mindful of salts, fatty foods and too  many sweets. MODERATION -     Lipid Panel  Elevated lipoprotein(a) On cholesterol medication refilled zetia -     Lipid Panel GAD (generalized anxiety disorder Controlled by Klonopin and only uses  as needed no abusive use of medication.  Other orders -     hydrOXYzine (ATARAX/VISTARIL) 25 MG tablet; Take 1 tablet (25 mg total) by mouth 3 (three) times daily as needed for anxiety. -     clonazePAM (KLONOPIN) 0.5 MG tablet; Take 1 tablet (0.5 mg total) by mouth daily as needed for anxiety.   Meds ordered this encounter  Medications  . hydrOXYzine (ATARAX/VISTARIL) 25 MG tablet    Sig: Take 1 tablet (25 mg total) by mouth 3 (three) times daily as needed for anxiety.    Dispense:  60 tablet    Refill:  3  . ezetimibe (ZETIA) 10 MG tablet    Sig: Take 1 tablet (10 mg total) by mouth daily.    Dispense:  90 tablet    Refill:  0  . clonazePAM (KLONOPIN) 0.5 MG tablet    Sig: Take 1 tablet (0.5 mg total) by mouth daily as needed for anxiety.    Dispense:  30 tablet    Refill:  0    Follow-up: Return in about 3 months (around 04/05/2019) for HTN, Lipidemia in person.    Grayce SessionsMichelle P Jaline Pincock, NP

## 2019-01-04 LAB — CMP14+EGFR
ALT: 11 IU/L (ref 0–32)
AST: 15 IU/L (ref 0–40)
Albumin/Globulin Ratio: 1.4 (ref 1.2–2.2)
Albumin: 4.6 g/dL (ref 3.6–4.6)
Alkaline Phosphatase: 137 IU/L — ABNORMAL HIGH (ref 39–117)
BUN/Creatinine Ratio: 11 — ABNORMAL LOW (ref 12–28)
BUN: 14 mg/dL (ref 8–27)
Bilirubin Total: 0.5 mg/dL (ref 0.0–1.2)
CO2: 25 mmol/L (ref 20–29)
Calcium: 10.2 mg/dL (ref 8.7–10.3)
Chloride: 98 mmol/L (ref 96–106)
Creatinine, Ser: 1.32 mg/dL — ABNORMAL HIGH (ref 0.57–1.00)
GFR calc Af Amer: 43 mL/min/{1.73_m2} — ABNORMAL LOW (ref >59)
GFR calc non Af Amer: 37 mL/min/{1.73_m2} — ABNORMAL LOW (ref >59)
Globulin, Total: 3.2 g/dL (ref 1.5–4.5)
Glucose: 104 mg/dL — ABNORMAL HIGH (ref 65–99)
Potassium: 4.4 mmol/L (ref 3.5–5.2)
Sodium: 139 mmol/L (ref 134–144)
Total Protein: 7.8 g/dL (ref 6.0–8.5)

## 2019-01-04 LAB — CBC WITH DIFFERENTIAL/PLATELET
Basophils Absolute: 0 10*3/uL (ref 0.0–0.2)
Basos: 1 %
EOS (ABSOLUTE): 0 10*3/uL (ref 0.0–0.4)
Eos: 1 %
Hematocrit: 42.6 % (ref 34.0–46.6)
Hemoglobin: 14.2 g/dL (ref 11.1–15.9)
Immature Grans (Abs): 0 10*3/uL (ref 0.0–0.1)
Immature Granulocytes: 0 %
Lymphocytes Absolute: 1.7 10*3/uL (ref 0.7–3.1)
Lymphs: 35 %
MCH: 29.2 pg (ref 26.6–33.0)
MCHC: 33.3 g/dL (ref 31.5–35.7)
MCV: 88 fL (ref 79–97)
Monocytes Absolute: 0.5 10*3/uL (ref 0.1–0.9)
Monocytes: 10 %
Neutrophils Absolute: 2.7 10*3/uL (ref 1.4–7.0)
Neutrophils: 53 %
Platelets: 316 10*3/uL (ref 150–450)
RBC: 4.86 x10E6/uL (ref 3.77–5.28)
RDW: 14.1 % (ref 11.7–15.4)
WBC: 4.9 10*3/uL (ref 3.4–10.8)

## 2019-01-04 LAB — LIPID PANEL
Chol/HDL Ratio: 2.7 ratio (ref 0.0–4.4)
Cholesterol, Total: 169 mg/dL (ref 100–199)
HDL: 62 mg/dL (ref >39)
LDL Chol Calc (NIH): 92 mg/dL (ref 0–99)
Triglycerides: 78 mg/dL (ref 0–149)
VLDL Cholesterol Cal: 15 mg/dL (ref 5–40)

## 2019-01-07 ENCOUNTER — Other Ambulatory Visit (INDEPENDENT_AMBULATORY_CARE_PROVIDER_SITE_OTHER): Payer: Self-pay | Admitting: Primary Care

## 2019-01-07 DIAGNOSIS — N183 Chronic kidney disease, stage 3 unspecified: Secondary | ICD-10-CM

## 2019-01-10 ENCOUNTER — Telehealth (INDEPENDENT_AMBULATORY_CARE_PROVIDER_SITE_OTHER): Payer: Self-pay

## 2019-01-10 NOTE — Telephone Encounter (Signed)
Patient is aware that labs are normal except kidney decline referred to nephrologist. She expressed understanding. Nat Christen, CMA

## 2019-01-10 NOTE — Telephone Encounter (Signed)
-----   Message from Kerin Perna, NP sent at 01/07/2019  4:48 PM EST ----- Labs were normal except decline in kidney function referred to nephrology

## 2019-02-10 ENCOUNTER — Other Ambulatory Visit (INDEPENDENT_AMBULATORY_CARE_PROVIDER_SITE_OTHER): Payer: Self-pay | Admitting: Primary Care

## 2019-02-10 DIAGNOSIS — I10 Essential (primary) hypertension: Secondary | ICD-10-CM

## 2019-02-10 NOTE — Telephone Encounter (Signed)
Sent to PCP ?

## 2019-03-31 ENCOUNTER — Other Ambulatory Visit (INDEPENDENT_AMBULATORY_CARE_PROVIDER_SITE_OTHER): Payer: Self-pay | Admitting: Primary Care

## 2019-03-31 NOTE — Telephone Encounter (Signed)
Sent to PCP ?

## 2019-04-04 ENCOUNTER — Ambulatory Visit (INDEPENDENT_AMBULATORY_CARE_PROVIDER_SITE_OTHER): Payer: Medicare Other | Admitting: Primary Care

## 2019-04-07 ENCOUNTER — Other Ambulatory Visit (INDEPENDENT_AMBULATORY_CARE_PROVIDER_SITE_OTHER): Payer: Self-pay | Admitting: Primary Care

## 2019-04-07 DIAGNOSIS — I10 Essential (primary) hypertension: Secondary | ICD-10-CM

## 2019-04-14 ENCOUNTER — Ambulatory Visit (INDEPENDENT_AMBULATORY_CARE_PROVIDER_SITE_OTHER): Payer: Medicare Other | Admitting: Primary Care

## 2019-06-16 ENCOUNTER — Other Ambulatory Visit: Payer: Self-pay | Admitting: Primary Care

## 2019-06-16 DIAGNOSIS — Z760 Encounter for issue of repeat prescription: Secondary | ICD-10-CM

## 2019-06-16 NOTE — Telephone Encounter (Signed)
Sent to PCP ?

## 2019-06-27 ENCOUNTER — Other Ambulatory Visit (INDEPENDENT_AMBULATORY_CARE_PROVIDER_SITE_OTHER): Payer: Self-pay

## 2019-06-30 ENCOUNTER — Other Ambulatory Visit (INDEPENDENT_AMBULATORY_CARE_PROVIDER_SITE_OTHER): Payer: Self-pay | Admitting: Primary Care

## 2019-06-30 DIAGNOSIS — I10 Essential (primary) hypertension: Secondary | ICD-10-CM

## 2019-06-30 DIAGNOSIS — F411 Generalized anxiety disorder: Secondary | ICD-10-CM

## 2019-06-30 MED ORDER — ESCITALOPRAM OXALATE 20 MG PO TABS: 20.0000 mg | ORAL_TABLET | Freq: Every day | ORAL | 0 refills | Status: DC

## 2019-06-30 MED ORDER — HYDROCHLOROTHIAZIDE 25 MG PO TABS: 25.0000 mg | ORAL_TABLET | Freq: Every day | ORAL | 0 refills | Status: DC

## 2019-06-30 NOTE — Telephone Encounter (Signed)
1) Medication(s) Requested (by name):   escitalopram (LEXAPRO) 20 MG tablet  & hYDROCHLOROTHIAZIDE 25MG     2) Pharmacy of Choice:  [facility]  # [phone]  [address]    3) Special Requests: Patient said that the pharmacy sent rx     Approved medications will be sent to the pharmacy, we will reach out if there is an issue.  Requests made after 3pm may not be addressed until the following business day!  If a patient is unsure of the name of the medication(s) please note and ask patient to call back when they are able to provide all info, do not send to responsible party until all information is available!

## 2019-07-03 ENCOUNTER — Other Ambulatory Visit: Payer: Self-pay | Admitting: Nephrology

## 2019-07-03 DIAGNOSIS — N183 Chronic kidney disease, stage 3 unspecified: Secondary | ICD-10-CM

## 2019-07-11 ENCOUNTER — Ambulatory Visit
Admission: RE | Admit: 2019-07-11 | Discharge: 2019-07-11 | Disposition: A | Discharge status: Home / Self Care | Payer: Medicare Other | Source: Ambulatory Visit | Attending: Nephrology | Admitting: Nephrology

## 2019-07-11 DIAGNOSIS — N183 Chronic kidney disease, stage 3 unspecified: Secondary | ICD-10-CM

## 2019-07-16 ENCOUNTER — Other Ambulatory Visit (INDEPENDENT_AMBULATORY_CARE_PROVIDER_SITE_OTHER): Payer: Self-pay | Admitting: Primary Care

## 2019-07-16 DIAGNOSIS — I10 Essential (primary) hypertension: Secondary | ICD-10-CM

## 2019-07-16 NOTE — Telephone Encounter (Signed)
Needs appointment for refills.

## 2019-07-16 NOTE — Telephone Encounter (Signed)
Sent to PCP ?

## 2019-07-18 NOTE — Telephone Encounter (Signed)
Patient aware that she needs to schedule an appointment. States she will call back to schedule.

## 2019-07-21 ENCOUNTER — Other Ambulatory Visit: Payer: Self-pay | Admitting: Nephrology

## 2019-07-21 DIAGNOSIS — N281 Cyst of kidney, acquired: Secondary | ICD-10-CM

## 2019-08-08 ENCOUNTER — Other Ambulatory Visit: Payer: Self-pay

## 2019-08-08 ENCOUNTER — Encounter (INDEPENDENT_AMBULATORY_CARE_PROVIDER_SITE_OTHER): Payer: Self-pay | Admitting: Primary Care

## 2019-08-08 ENCOUNTER — Ambulatory Visit (INDEPENDENT_AMBULATORY_CARE_PROVIDER_SITE_OTHER): Payer: Medicare Other | Admitting: Primary Care

## 2019-08-08 VITALS — BP 144/78 | HR 72 | Temp 97.2°F | Ht 67.0 in | Wt 210.8 lb

## 2019-08-08 DIAGNOSIS — F411 Generalized anxiety disorder: Secondary | ICD-10-CM | POA: Diagnosis not present

## 2019-08-08 DIAGNOSIS — G4700 Insomnia, unspecified: Secondary | ICD-10-CM

## 2019-08-08 DIAGNOSIS — N183 Chronic kidney disease, stage 3 unspecified: Secondary | ICD-10-CM | POA: Diagnosis not present

## 2019-08-08 DIAGNOSIS — I10 Essential (primary) hypertension: Secondary | ICD-10-CM | POA: Diagnosis not present

## 2019-08-08 DIAGNOSIS — Z760 Encounter for issue of repeat prescription: Secondary | ICD-10-CM

## 2019-08-08 DIAGNOSIS — E782 Mixed hyperlipidemia: Secondary | ICD-10-CM

## 2019-08-08 MED ORDER — ESCITALOPRAM OXALATE 20 MG PO TABS: 20.0000 mg | ORAL_TABLET | Freq: Every day | ORAL | 1 refills | Status: DC

## 2019-08-08 MED ORDER — CLONAZEPAM 0.5 MG PO TABS: ORAL_TABLET | ORAL | 0 refills | Status: DC

## 2019-08-08 MED ORDER — EZETIMIBE 10 MG PO TABS: 10.0000 mg | ORAL_TABLET | Freq: Every day | ORAL | 0 refills | Status: DC

## 2019-08-08 MED ORDER — HYDROCHLOROTHIAZIDE 25 MG PO TABS: 25.0000 mg | ORAL_TABLET | Freq: Every day | ORAL | 1 refills | Status: DC

## 2019-08-08 MED ORDER — HYDROXYZINE HCL 25 MG PO TABS: 25.0000 mg | ORAL_TABLET | Freq: Three times a day (TID) | ORAL | 3 refills | Status: DC | PRN

## 2019-08-08 MED ORDER — ATORVASTATIN CALCIUM 40 MG PO TABS: ORAL_TABLET | ORAL | 0 refills | Status: DC

## 2019-08-08 MED ORDER — AMLODIPINE BESYLATE 10 MG PO TABS: 10.0000 mg | ORAL_TABLET | Freq: Every day | ORAL | 1 refills | Status: DC

## 2019-08-08 NOTE — Progress Notes (Signed)
Established Patient Office Visit  Subjective:  Patient ID: Leah Anderson, female    DOB: 04-12-1933  Age: 86 y.o. MRN: [id number]  CC:  Chief Complaint  Patient presents with  . Hypertension  . Medication Refill    HPI Leah Anderson is a 86 year old female presents today hypertension management  and medication refills . Previously referred to nephrologist and followed for stage 3 kidney disease and shares with me the list the "kidney doctor" gave her what not to take. She keeps it in her purse for reminder. Denies shortness of breath, headaches, chest pain or lower extremity edema  Past Medical History:  Diagnosis Date  . Arthritis   . Hyperlipidemia   . Hypertension     Past Surgical History:  Procedure Laterality Date  . APPENDECTOMY    . CHOLECYSTECTOMY    . THYROIDECTOMY, PARTIAL      Family History  Problem Relation Age of Onset  . Cancer Mother   . Cancer Sister     Social History   Socioeconomic History  . Marital status: Single    Spouse name: Not on file  . Number of children: Not on file  . Years of education: Not on file  . Highest education level: Not on file  Occupational History  . Not on file  Tobacco Use  . Smoking status: Former Smoker    Types: Cigarettes  . Smokeless tobacco: Never Used  Vaping Use  . Vaping Use: Never used  Substance and Sexual Activity  . Alcohol use: No  . Drug use: No  . Sexual activity: Not Currently  Other Topics Concern  . Not on file  Social History Narrative  . Not on file   Social Determinants of Health   Financial Resource Strain:   . Difficulty of Paying Living Expenses:   Food Insecurity:   . Worried About Charity fundraiser in the Last Year:   . Arboriculturist in the Last Year:   Transportation Needs:   . Film/video editor (Medical):   Marland Kitchen Lack of Transportation (Non-Medical):   Physical Activity:   . Days of Exercise per Week:   . Minutes of Exercise per Session:   Stress:   .  Feeling of Stress :   Social Connections:   . Frequency of Communication with Friends and Family:   . Frequency of Social Gatherings with Friends and Family:   . Attends Religious Services:   . Active Member of Clubs or Organizations:   . Attends Archivist Meetings:   Marland Kitchen Marital Status:   Intimate Partner Violence:   . Fear of Current or Ex-Partner:   . Emotionally Abused:   Marland Kitchen Physically Abused:   . Sexually Abused:     Outpatient Medications Prior to Visit  Medication Sig Dispense Refill  . amLODipine (NORVASC) 10 MG tablet TAKE 1 TABLET BY MOUTH DAILY 30 tablet 0  . atorvastatin (LIPITOR) 40 MG tablet TAKE 1 TABLET BY MOUTH ONCE DAILY  "OV NEEDED FOR REFILLS" 30 tablet 11  . clonazePAM (KLONOPIN) 0.5 MG tablet TAKE 1 TABLET BY MOUTH EVERY DAY AS NEEDED FOR ANXIETY 30 tablet 0  . escitalopram (LEXAPRO) 20 MG tablet Take 1 tablet (20 mg total) by mouth daily. 30 tablet 0  . ezetimibe (ZETIA) 10 MG tablet Take 1 tablet (10 mg total) by mouth daily. 90 tablet 0  . hydrochlorothiazide (HYDRODIURIL) 25 MG tablet TAKE 1 TABLET BY MOUTH ONCE DAILY 30 tablet 0  .  hydrOXYzine (ATARAX/VISTARIL) 25 MG tablet Take 1 tablet (25 mg total) by mouth 3 (three) times daily as needed for anxiety. 60 tablet 3   No facility-administered medications prior to visit.    No Known Allergies  ROS Review of Systems  Psychiatric/Behavioral: Positive for sleep disturbance. The patient is nervous/anxious.        Wakes up mind racing and thinks about son who past September 12, 2017  All other systems reviewed and are negative.     Objective:    Physical Exam  General: Vital signs reviewed.  Patient is well-developed and well-nourished, in no acute distress and cooperative with exam.  Head: Normocephalic and atraumatic. Eyes: EOMI, conjunctivae normal, no scleral icterus.  Neck: Supple, trachea midline, normal ROM, no JVD, masses, thyromegaly, or carotid bruit present.  Cardiovascular: RRR, S1  normal, S2 normal, no murmurs, gallops, or rubs. Pulmonary/Chest: Clear to auscultation bilaterally, no wheezes, rales, or rhonchi. Abdominal: Soft, non-tender, non-distended, BS +, no masses, organomegaly, or guarding present.  Musculoskeletal: No joint deformities, erythema, or stiffness, ROM full and nontender. Extremities: No lower extremity edema bilaterally,  pulses symmetric and intact bilaterally. No cyanosis or clubbing. Neurological: A&O x3, Strength is normal and symmetric bilaterally, cranial nerve II-XII are grossly intact, no focal motor deficit, sensory intact to light touch bilaterally.  Skin: Warm, dry and intact. No rashes or erythema. Psychiatric: Normal mood and affect. speech and behavior is normal. Cognition and memory are normal.  BP (!) 144/78 (BP Location: Left Arm, Patient Position: Sitting, Cuff Size: Large)   Pulse 72   Temp (!) 97.2 F (36.2 C) (Other (Comment))   Ht 5' 7"  (1.702 m)   Wt 210 lb 12.8 oz (95.6 kg)   SpO2 96%   BMI 33.02 kg/m  Wt Readings from Last 3 Encounters:  08/08/19 210 lb 12.8 oz (95.6 kg)  01/03/19 211 lb 3.2 oz (95.8 kg)  11/15/18 213 lb (96.6 kg)     Health Maintenance Due  Topic Date Due  . COVID-19 Vaccine (1) Never done  . PNA vac Low Risk Adult (2 of 2 - PPSV23) 06/21/2018    There are no preventive care reminders to display for this patient.  Lab Results  Component Value Date   TSH 1.990 05/23/2017   Lab Results  Component Value Date   WBC 4.9 01/03/2019   HGB 14.2 01/03/2019   HCT 42.6 01/03/2019   MCV 88 01/03/2019   PLT 316 01/03/2019   Lab Results  Component Value Date   NA 139 01/03/2019   K 4.4 01/03/2019   CO2 25 01/03/2019   GLUCOSE 104 (H) 01/03/2019   BUN 14 01/03/2019   CREATININE 1.32 (H) 01/03/2019   BILITOT 0.5 01/03/2019   ALKPHOS 137 (H) 01/03/2019   AST 15 01/03/2019   ALT 11 01/03/2019   PROT 7.8 01/03/2019   ALBUMIN 4.6 01/03/2019   CALCIUM 10.2 01/03/2019   ANIONGAP 11 08/10/2017    Lab Results  Component Value Date   CHOL 169 01/03/2019   Lab Results  Component Value Date   HDL 62 01/03/2019   Lab Results  Component Value Date   LDLCALC 92 01/03/2019   Lab Results  Component Value Date   TRIG 78 01/03/2019   Lab Results  Component Value Date   CHOLHDL 2.7 01/03/2019   Lab Results  Component Value Date   HGBA1C 5.5 05/23/2017      Assessment & Plan:  Cela was seen today for hypertension and medication refill.  Diagnoses and all orders for this visit:  Stage 3 chronic kidney disease, unspecified whether stage 3a or 3b CKD -     CBC with Differential -     CMP14+EGFR  GAD (generalized anxiety disorder) Managed with SSRI and low dose and  Benzodiazepine . July is a hard month she lost son. -     escitalopram (LEXAPRO) 20 MG tablet; Take 1 tablet (20 mg total) by mouth daily. -     clonazePAM (KLONOPIN) 0.5 MG tablet; TAKE 1 TABLET BY MOUTH EVERY DAY AS NEEDED FOR ANXIETY  Medication refill -     ezetimibe (ZETIA) 10 MG tablet; Take 1 tablet (10 mg total) by mouth daily. -     atorvastatin (LIPITOR) 40 MG tablet; TAKE 1 TABLET BY MOUTH ONCE DAILY  Hypertension, unspecified type Counseled on blood pressure goal of less than 130/80, low-sodium, DASH diet, medication compliance, 150 minutes of moderate intensity exercise per week. Discussed medication compliance, adverse effects. -     CBC with Differential -     CMP14+EGFR  Continue to take -hydrochlorothiazide (HYDRODIURIL) 25 MG tablet; Take 1 tablet (25 mg total) by mouth daily. -     amLODipine (NORVASC) 10 MG tablet; Take 1 tablet (10 mg total) by mouth daily.  Insomnia, unspecified type Hydroxyzine 19m at night for sleep,  at night for sleep as needed CAUSES   Stress, anxiety, and depression.  Poor sleeping habits.  Distractions such as TV in the bedroom.  Naps close to bedtime.  Engaging in emotionally charged conversations before bed.  Technical reading before  sleep.  Alcohol and other sedatives. They may make the problem worse. They can hurt normal sleep patterns and normal dream activity.  Stimulants such as caffeine for several hours prior to bedtime.  Mixed hyperlipidemia  decrease your fatty foods, red meat, cheese, milk and increase fiber like whole grains and veggies. You can also add a fiber supplement like Metamucil or Benefiber.  -     Lipid Panel  Other orders -     hydrOXYzine (ATARAX/VISTARIL) 25 MG tablet; Take 1 tablet (25 mg total) by mouth 3 (three) times daily as needed for anxiety.   Meds ordered this encounter  Medications  . hydrOXYzine (ATARAX/VISTARIL) 25 MG tablet    Sig: Take 1 tablet (25 mg total) by mouth 3 (three) times daily as needed for anxiety.    Dispense:  60 tablet    Refill:  3  . escitalopram (LEXAPRO) 20 MG tablet    Sig: Take 1 tablet (20 mg total) by mouth daily.    Dispense:  90 tablet    Refill:  1  . ezetimibe (ZETIA) 10 MG tablet    Sig: Take 1 tablet (10 mg total) by mouth daily.    Dispense:  90 tablet    Refill:  0  . hydrochlorothiazide (HYDRODIURIL) 25 MG tablet    Sig: Take 1 tablet (25 mg total) by mouth daily.    Dispense:  90 tablet    Refill:  1    **Patient requests 90 days supply**  . atorvastatin (LIPITOR) 40 MG tablet    Sig: TAKE 1 TABLET BY MOUTH ONCE DAILY    Dispense:  90 tablet    Refill:  0  . amLODipine (NORVASC) 10 MG tablet    Sig: Take 1 tablet (10 mg total) by mouth daily.    Dispense:  90 tablet    Refill:  1  . clonazePAM (KLONOPIN) 0.5 MG tablet  Sig: TAKE 1 TABLET BY MOUTH EVERY DAY AS NEEDED FOR ANXIETY    Dispense:  60 tablet    Refill:  0    Follow-up: Return in about 7 weeks (around 09/29/2019), or Depression anxiety in person lost 2 sons in July follow up (Tuesday  please).    Kerin Perna, NP

## 2019-08-08 NOTE — Patient Instructions (Signed)
Managing Loss, Adult People experience loss in many different ways throughout their lives. Events such as moving, changing jobs, and losing friends can create a sense of loss. The loss may be as serious as a major health change, divorce, death of a pet, or death of a loved one. All of these types of loss are likely to create a physical and emotional reaction known as grief. Grief is the result of a major change or an absence of something or someone that you count on. Grief is a normal reaction to loss. A variety of factors can affect your grieving experience, including:  The nature of your loss.  Your relationship to what or whom you lost.  Your understanding of grief and how to manage it.  Your support system. How to manage lifestyle changes Keep to your normal routine as much as possible.  If you have trouble focusing or doing normal activities, it is acceptable to take some time away from your normal routine.  Spend time with friends and loved ones.  Eat a healthy diet, get plenty of sleep, and rest when you feel tired. How to recognize changes  The way that you deal with your grief will affect your ability to function as you normally do. When grieving, you may experience these changes:  Numbness, shock, sadness, anxiety, anger, denial, and guilt.  Thoughts about death.  Unexpected crying.  A physical sensation of emptiness in your stomach.  Problems sleeping and eating.  Tiredness (fatigue).  Loss of interest in normal activities.  Dreaming about or imagining seeing the person who died.  A need to remember what or whom you lost.  Difficulty thinking about anything other than your loss for a period of time.  Relief. If you have been expecting the loss for a while, you may feel a sense of relief when it happens. Follow these instructions at home:  Activity Express your feelings in healthy ways, such as:  Talking with others about your loss. It may be helpful to find  others who have had a similar loss, such as a support group.  Writing down your feelings in a journal.  Doing physical activities to release stress and emotional energy.  Doing creative activities like painting, sculpting, or playing or listening to music.  Practicing resilience. This is the ability to recover and adjust after facing challenges. Reading some resources that encourage resilience may help you to learn ways to practice those behaviors. General instructions  Be patient with yourself and others. Allow the grieving process to happen, and remember that grieving takes time. ? It is likely that you may never feel completely done with some grief. You may find a way to move on while still cherishing memories and feelings about your loss. ? Accepting your loss is a process. It can take months or longer to adjust.  Keep all follow-up visits as told by your health care provider. This is important. Where to find support To get support for managing loss:  Ask your health care provider for help and recommendations, such as grief counseling or therapy.  Think about joining a support group for people who are managing a loss. Where to find more information You can find more information about managing loss from:  American Society of Clinical Oncology: www.cancer.net  American Psychological Association: www.apa.org Contact a health care provider if:  Your grief is extreme and keeps getting worse.  You have ongoing grief that does not improve.  Your body shows symptoms of grief, such   as illness.  You feel depressed, anxious, or lonely. Get help right away if:  You have thoughts about hurting yourself or others. If you ever feel like you may hurt yourself or others, or have thoughts about taking your own life, get help right away. You can go to your nearest emergency department or call:  Your local emergency services (911 in the [place]  A suicide crisis helpline, such as the  National Suicide Prevention Lifeline at 1-800-273-8255. This is open 24 hours a day. Summary  Grief is the result of a major change or an absence of someone or something that you count on. Grief is a normal reaction to loss.  The depth of grief and the period of recovery depend on the type of loss and your ability to adjust to the change and process your feelings.  Processing grief requires patience and a willingness to accept your feelings and talk about your loss with people who are supportive.  It is important to find resources that work for you and to realize that people experience grief differently. There is not one grieving process that works for everyone in the same way.  Be aware that when grief becomes extreme, it can lead to more severe issues like isolation, depression, anxiety, or suicidal thoughts. Talk with your health care provider if you have any of these issues. This information is not intended to replace advice given to you by your health care provider. Make sure you discuss any questions you have with your health care provider. Document Revised: 04/19/2018 Document Reviewed: 06/29/2016 Elsevier Patient Education  2020 [employer].  

## 2019-08-09 LAB — CBC WITH DIFFERENTIAL/PLATELET
Basophils Absolute: 0 10*3/uL (ref 0.0–0.2)
Basos: 0 %
EOS (ABSOLUTE): 0.1 10*3/uL (ref 0.0–0.4)
Eos: 1 %
Hematocrit: 42.5 % (ref 34.0–46.6)
Hemoglobin: 14.2 g/dL (ref 11.1–15.9)
Immature Grans (Abs): 0 10*3/uL (ref 0.0–0.1)
Immature Granulocytes: 0 %
Lymphocytes Absolute: 2.4 10*3/uL (ref 0.7–3.1)
Lymphs: 51 %
MCH: 29.6 pg (ref 26.6–33.0)
MCHC: 33.4 g/dL (ref 31.5–35.7)
MCV: 89 fL (ref 79–97)
Monocytes Absolute: 0.6 10*3/uL (ref 0.1–0.9)
Monocytes: 12 %
Neutrophils Absolute: 1.7 10*3/uL (ref 1.4–7.0)
Neutrophils: 36 %
Platelets: 277 10*3/uL (ref 150–450)
RBC: 4.8 x10E6/uL (ref 3.77–5.28)
RDW: 14.3 % (ref 11.7–15.4)
WBC: 4.7 10*3/uL (ref 3.4–10.8)

## 2019-08-09 LAB — LIPID PANEL
Chol/HDL Ratio: 3.8 ratio (ref 0.0–4.4)
Cholesterol, Total: 218 mg/dL — ABNORMAL HIGH (ref 100–199)
HDL: 57 mg/dL (ref >39)
LDL Chol Calc (NIH): 143 mg/dL — ABNORMAL HIGH (ref 0–99)
Triglycerides: 104 mg/dL (ref 0–149)
VLDL Cholesterol Cal: 18 mg/dL (ref 5–40)

## 2019-08-09 LAB — CMP14+EGFR
ALT: 21 IU/L (ref 0–32)
AST: 25 IU/L (ref 0–40)
Albumin/Globulin Ratio: 1.4 (ref 1.2–2.2)
Albumin: 4.6 g/dL (ref 3.6–4.6)
Alkaline Phosphatase: 149 IU/L — ABNORMAL HIGH (ref 48–121)
BUN/Creatinine Ratio: 18 (ref 12–28)
BUN: 25 mg/dL (ref 8–27)
Bilirubin Total: 0.4 mg/dL (ref 0.0–1.2)
CO2: 24 mmol/L (ref 20–29)
Calcium: 9.9 mg/dL (ref 8.7–10.3)
Chloride: 97 mmol/L (ref 96–106)
Creatinine, Ser: 1.37 mg/dL — ABNORMAL HIGH (ref 0.57–1.00)
GFR calc Af Amer: 41 mL/min/{1.73_m2} — ABNORMAL LOW (ref >59)
GFR calc non Af Amer: 36 mL/min/{1.73_m2} — ABNORMAL LOW (ref >59)
Globulin, Total: 3.3 g/dL (ref 1.5–4.5)
Glucose: 111 mg/dL — ABNORMAL HIGH (ref 65–99)
Potassium: 4.4 mmol/L (ref 3.5–5.2)
Sodium: 136 mmol/L (ref 134–144)
Total Protein: 7.9 g/dL (ref 6.0–8.5)

## 2019-08-18 ENCOUNTER — Other Ambulatory Visit: Payer: Self-pay | Admitting: Nephrology

## 2019-08-18 ENCOUNTER — Other Ambulatory Visit (INDEPENDENT_AMBULATORY_CARE_PROVIDER_SITE_OTHER): Payer: Self-pay | Admitting: Primary Care

## 2019-08-18 ENCOUNTER — Telehealth (INDEPENDENT_AMBULATORY_CARE_PROVIDER_SITE_OTHER): Payer: Self-pay

## 2019-08-18 DIAGNOSIS — Z760 Encounter for issue of repeat prescription: Secondary | ICD-10-CM

## 2019-08-18 MED ORDER — ATORVASTATIN CALCIUM 20 MG PO TABS: ORAL_TABLET | ORAL | 1 refills | Status: DC

## 2019-08-18 NOTE — Telephone Encounter (Signed)
-----   Message from Grayce Sessions, NP sent at 08/18/2019  8:59 AM EDT ----- Decline in kidney function dx of stage 3 kidney disease. Increase water decrease soda intake and continue to monitor. Cholesterol is elevated monitor will send in atorvastatin 20mg   Try to decrease your fatty foods, red meat, cheese, milk and increase fiber like whole grains and veggies.

## 2019-08-18 NOTE — Telephone Encounter (Signed)
Patient aware to drink more water and increase fruit and veggie intake. She is aware that cholesterol medication atorvastatin has been refilled but dosage decreased to 20 mg. She verbalized understanding. Leah Anderson, CMA

## 2019-08-21 ENCOUNTER — Other Ambulatory Visit: Payer: Self-pay

## 2019-08-21 ENCOUNTER — Ambulatory Visit
Admission: RE | Admit: 2019-08-21 | Discharge: 2019-08-21 | Disposition: A | Discharge status: Home / Self Care | Payer: Medicare Other | Source: Ambulatory Visit | Attending: Nephrology | Admitting: Nephrology

## 2019-08-21 DIAGNOSIS — N281 Cyst of kidney, acquired: Secondary | ICD-10-CM

## 2019-08-21 MED ORDER — GADOBENATE DIMEGLUMINE 529 MG/ML IV SOLN
20.0000 mL | Freq: Once | INTRAVENOUS | Status: AC | PRN
  Administered 2019-08-21: 20 mL via INTRAVENOUS

## 2019-09-14 ENCOUNTER — Other Ambulatory Visit (INDEPENDENT_AMBULATORY_CARE_PROVIDER_SITE_OTHER): Payer: Self-pay | Admitting: Primary Care

## 2019-09-14 DIAGNOSIS — Z760 Encounter for issue of repeat prescription: Secondary | ICD-10-CM

## 2019-09-23 ENCOUNTER — Ambulatory Visit (INDEPENDENT_AMBULATORY_CARE_PROVIDER_SITE_OTHER): Payer: Medicare Other | Admitting: Primary Care

## 2019-09-30 ENCOUNTER — Ambulatory Visit (INDEPENDENT_AMBULATORY_CARE_PROVIDER_SITE_OTHER): Payer: Medicare Other | Admitting: Primary Care

## 2019-10-02 ENCOUNTER — Other Ambulatory Visit (INDEPENDENT_AMBULATORY_CARE_PROVIDER_SITE_OTHER): Payer: Self-pay | Admitting: Primary Care

## 2019-10-02 MED ORDER — HYDROXYZINE HCL 25 MG PO TABS: 25.0000 mg | ORAL_TABLET | Freq: Three times a day (TID) | ORAL | 3 refills | Status: DC | PRN

## 2019-11-02 ENCOUNTER — Other Ambulatory Visit: Payer: Self-pay

## 2019-11-02 ENCOUNTER — Emergency Department (HOSPITAL_COMMUNITY)
Admission: EM | Admit: 2019-11-02 | Discharge: 2019-11-03 | Disposition: A | Discharge status: Home / Self Care | Payer: Medicare (Managed Care) | Attending: Emergency Medicine | Admitting: Emergency Medicine

## 2019-11-02 ENCOUNTER — Encounter (HOSPITAL_COMMUNITY): Payer: Self-pay | Admitting: Emergency Medicine

## 2019-11-02 ENCOUNTER — Emergency Department (HOSPITAL_COMMUNITY): Payer: Medicare (Managed Care)

## 2019-11-02 DIAGNOSIS — R0602 Shortness of breath: Secondary | ICD-10-CM | POA: Diagnosis not present

## 2019-11-02 DIAGNOSIS — I3139 Other pericardial effusion (noninflammatory): Secondary | ICD-10-CM

## 2019-11-02 DIAGNOSIS — I313 Pericardial effusion (noninflammatory): Secondary | ICD-10-CM | POA: Diagnosis not present

## 2019-11-02 DIAGNOSIS — I10 Essential (primary) hypertension: Secondary | ICD-10-CM | POA: Diagnosis not present

## 2019-11-02 DIAGNOSIS — R079 Chest pain, unspecified: Secondary | ICD-10-CM | POA: Diagnosis not present

## 2019-11-02 DIAGNOSIS — Z79899 Other long term (current) drug therapy: Secondary | ICD-10-CM | POA: Insufficient documentation

## 2019-11-02 DIAGNOSIS — J90 Pleural effusion, not elsewhere classified: Secondary | ICD-10-CM

## 2019-11-02 DIAGNOSIS — Z87891 Personal history of nicotine dependence: Secondary | ICD-10-CM | POA: Diagnosis not present

## 2019-11-02 LAB — BASIC METABOLIC PANEL
Anion gap: 11 (ref 5–15)
BUN: 17 mg/dL (ref 8–23)
CO2: 29 mmol/L (ref 22–32)
Calcium: 9.5 mg/dL (ref 8.9–10.3)
Chloride: 97 mmol/L — ABNORMAL LOW (ref 98–111)
Creatinine, Ser: 1.29 mg/dL — ABNORMAL HIGH (ref 0.44–1.00)
GFR calc Af Amer: 44 mL/min — ABNORMAL LOW (ref >60)
GFR calc non Af Amer: 38 mL/min — ABNORMAL LOW (ref >60)
Glucose, Bld: 128 mg/dL — ABNORMAL HIGH (ref 70–99)
Potassium: 3.5 mmol/L (ref 3.5–5.1)
Sodium: 137 mmol/L (ref 135–145)

## 2019-11-02 LAB — TROPONIN I (HIGH SENSITIVITY): Troponin I (High Sensitivity): 5 ng/L (ref <18)

## 2019-11-02 LAB — CBC
HCT: 38.7 % (ref 36.0–46.0)
Hemoglobin: 12.6 g/dL (ref 12.0–15.0)
MCH: 29.8 pg (ref 26.0–34.0)
MCHC: 32.6 g/dL (ref 30.0–36.0)
MCV: 91.5 fL (ref 80.0–100.0)
Platelets: 357 10*3/uL (ref 150–400)
RBC: 4.23 MIL/uL (ref 3.87–5.11)
RDW: 14.7 % (ref 11.5–15.5)
WBC: 5.8 10*3/uL (ref 4.0–10.5)
nRBC: 0 % (ref 0.0–0.2)

## 2019-11-02 NOTE — ED Triage Notes (Signed)
Pt reports pain under L breast and SOB since Thursday.  Denies nausea and vomiting.

## 2019-11-03 ENCOUNTER — Emergency Department (HOSPITAL_COMMUNITY): Payer: Medicare (Managed Care)

## 2019-11-03 LAB — TROPONIN I (HIGH SENSITIVITY)
Troponin I (High Sensitivity): 5 ng/L (ref <18)
Troponin I (High Sensitivity): 8 ng/L (ref <18)

## 2019-11-03 MED ORDER — FENTANYL CITRATE (PF) 100 MCG/2ML IJ SOLN
50.0000 ug | Freq: Once | INTRAMUSCULAR | Status: AC
  Administered 2019-11-03: 50 ug via INTRAVENOUS
  Filled 2019-11-03: qty 2

## 2019-11-03 MED ORDER — IOHEXOL 350 MG/ML SOLN
60.0000 mL | Freq: Once | INTRAVENOUS | Status: AC | PRN
  Administered 2019-11-03: 60 mL via INTRAVENOUS

## 2019-11-03 MED ORDER — ACETAMINOPHEN 325 MG PO TABS
650.0000 mg | ORAL_TABLET | Freq: Once | ORAL | Status: AC
  Administered 2019-11-03: 650 mg via ORAL
  Filled 2019-11-03: qty 2

## 2019-11-03 NOTE — ED Provider Notes (Signed)
Care received from [place] PA-C at shift change.  Please see her note for full HPI.  In short, 86 year old female with onset of left-sided sharp chest pain under her left breast which has been going on for the last 4 to 5 days.  Worse on inspiration.  Basic labs without leukocytosis, normal hemoglobin, no electrolyte abnormalities on BMP, creatinine of 1.29 and appears at baseline.  CT angio of the chest without evidence of PE, though there was noted to be a small left-sided pleural effusion, as well as mild cardiomegaly and a pericardial effusion of questionable significance.  Initial troponin of 5, EKG nonacute/nonischemic.  Care received pending follow-up on delta troponins.  I personally reviewed this, delta troponin normal.  On reevaluation, patient notes significant improvement in her pain. Vitals remain stable.  I updated her on her work-up, she is stable for follow-up with cardiology.  She voices understanding and is agreeable.  At this stage in the ED course, the patient has been medically screened and stable for discharge Physical Exam  BP (!) 125/49   Pulse 71   Temp 98 F (36.7 C) (Oral)   Resp 19   SpO2 100%   Physical Exam Vitals and nursing note reviewed.  Constitutional:      General: She is not in acute distress.    Appearance: She is well-developed.  HENT:     Head: Normocephalic and atraumatic.  Eyes:     Conjunctiva/sclera: Conjunctivae normal.     Pupils: Pupils are equal, round, and reactive to light.  Cardiovascular:     Rate and Rhythm: Normal rate and regular rhythm.     Pulses:          Radial pulses are 2+ on the right side and 2+ on the left side.     Heart sounds: Normal heart sounds. No murmur heard.   Pulmonary:     Effort: Pulmonary effort is normal. No respiratory distress.     Breath sounds: Normal breath sounds.  Chest:     Chest wall: No tenderness.  Abdominal:     Palpations: Abdomen is soft.     Tenderness: There is no abdominal  tenderness.  Musculoskeletal:        General: Normal range of motion.     Cervical back: Normal range of motion and neck supple.     Right lower leg: No tenderness. No edema.     Left lower leg: No tenderness. No edema.  Skin:    General: Skin is warm and dry.     Capillary Refill: Capillary refill takes less than 2 seconds.  Neurological:     General: No focal deficit present.     Mental Status: She is alert.  Psychiatric:        Mood and Affect: Mood normal.        Behavior: Behavior normal.     ED Course/Procedures   Clinical Course as of Nov 03 807  Sun Nov 02, 2019  2240 ED EKG [RK]    Clinical Course User Index [RK] Melene Plan, MD    Procedures  MDM        Mare Ferrari, PA-C 11/03/19 0818    Milagros Loll, MD 11/05/19 2135

## 2019-11-03 NOTE — Discharge Instructions (Addendum)
You can be discharged home but will need to continue your evaluation of your chest pain with cardiology in the outpatient setting. Please call [facility] and make an appointment.   Please take Tylenol for pain   Return to the emergency department with any new or concerning symptoms.

## 2019-11-03 NOTE — ED Provider Notes (Signed)
[facility] EMERGENCY DEPARTMENT Provider Note   CSN: [id number] Arrival date & time: 11/02/19  1405     History Chief Complaint  Patient presents with  . Chest Pain  . Shortness of Breath    Leah Anderson is a 86 y.o. female.  Patient to ED with complaint of sharp pain under her left breast that started during the past 4-5 days. She denies injury. No cough, fever, congestion. She denies abdominal pain, nausea or vomiting. She reports SOB because the pain is much worse when she takes a deep breath. No other aggravating or alleviating factors. No history of similar symptoms. She denies LE pain or swelling. No history of blood clots.    Chest Pain Associated symptoms: shortness of breath   Associated symptoms: no abdominal pain, no back pain, no cough, no fever, no nausea and no weakness   Shortness of Breath Associated symptoms: chest pain   Associated symptoms: no abdominal pain, no cough and no fever        Past Medical History:  Diagnosis Date  . Arthritis   . Hyperlipidemia   . Hypertension     Patient Active Problem List   Diagnosis Date Noted  . Hyperlipidemia 10/15/2013  . Unspecified essential hypertension 07/29/2013  . Anxiety 07/29/2013    Past Surgical History:  Procedure Laterality Date  . APPENDECTOMY    . CHOLECYSTECTOMY    . THYROIDECTOMY, PARTIAL       OB History   No obstetric history on file.     Family History  Problem Relation Age of Onset  . Cancer Mother   . Cancer Sister     Social History   Tobacco Use  . Smoking status: Former Smoker    Types: Cigarettes  . Smokeless tobacco: Never Used  Vaping Use  . Vaping Use: Never used  Substance Use Topics  . Alcohol use: No  . Drug use: No    Home Medications Prior to Admission medications   Medication Sig Start Date End Date Taking? Authorizing Provider  amLODipine (NORVASC) 10 MG tablet Take 1 tablet (10 mg total) by mouth daily. 08/08/19   Grayce Sessions, NP  atorvastatin (LIPITOR) 20 MG tablet TAKE 1 TABLET BY MOUTH ONCE DAILY 08/18/19   Grayce Sessions, NP  clonazePAM (KLONOPIN) 0.5 MG tablet TAKE 1 TABLET BY MOUTH EVERY DAY AS NEEDED FOR ANXIETY 08/08/19   Grayce Sessions, NP  escitalopram (LEXAPRO) 20 MG tablet Take 1 tablet (20 mg total) by mouth daily. 08/08/19   Grayce Sessions, NP  ezetimibe (ZETIA) 10 MG tablet Take 1 tablet (10 mg total) by mouth daily. 08/08/19   Grayce Sessions, NP  hydrochlorothiazide (HYDRODIURIL) 25 MG tablet Take 1 tablet (25 mg total) by mouth daily. 08/08/19   Grayce Sessions, NP  hydrOXYzine (ATARAX/VISTARIL) 25 MG tablet Take 1 tablet (25 mg total) by mouth every 8 (eight) hours as needed for anxiety. 10/02/19   Grayce Sessions, NP    Allergies    Patient has no known allergies.  Review of Systems   Review of Systems  Constitutional: Negative for chills and fever.  HENT: Negative.   Respiratory: Positive for shortness of breath. Negative for cough.   Cardiovascular: Positive for chest pain. Negative for leg swelling.  Gastrointestinal: Negative.  Negative for abdominal pain and nausea.  Musculoskeletal: Negative.  Negative for back pain.  Skin: Negative.   Neurological: Negative.  Negative for syncope, weakness and light-headedness.  Physical Exam Updated Vital Signs BP (!) 127/53 (BP Location: Right Arm)   Pulse 71   Temp 98 F (36.7 C) (Oral)   Resp (!) 21   SpO2 100%   Physical Exam Vitals and nursing note reviewed.  Constitutional:      Appearance: She is well-developed.  HENT:     Head: Normocephalic.  Cardiovascular:     Rate and Rhythm: Normal rate and regular rhythm.     Heart sounds: No murmur heard.   Pulmonary:     Effort: Pulmonary effort is normal.     Breath sounds: Normal breath sounds. No wheezing, rhonchi or rales.  Chest:     Chest wall: No tenderness.  Abdominal:     General: Bowel sounds are normal.     Palpations: Abdomen is soft.      Tenderness: There is no abdominal tenderness. There is no guarding or rebound.  Musculoskeletal:        General: Normal range of motion.     Cervical back: Normal range of motion and neck supple.     Right lower leg: No edema.     Left lower leg: No edema.  Skin:    General: Skin is warm and dry.     Findings: No rash.  Neurological:     Mental Status: She is alert and oriented to person, place, and time.     ED Results / Procedures / Treatments   Labs (all labs ordered are listed, but only abnormal results are displayed) Labs Reviewed  BASIC METABOLIC PANEL - Abnormal; Notable for the following components:      Result Value   Chloride 97 (*)    Glucose, Bld 128 (*)    Creatinine, Ser 1.29 (*)    GFR calc non Af Amer 38 (*)    GFR calc Af Amer 44 (*)    All other components within normal limits  CBC  TROPONIN I (HIGH SENSITIVITY)  TROPONIN I (HIGH SENSITIVITY)    EKG EKG Interpretation  Date/Time:  Sunday November 02 2019 14:44:34 EDT Ventricular Rate:  70 PR Interval:  158 QRS Duration: 90 QT Interval:  380 QTC Calculation: 410 R Axis:   57 Text Interpretation: Normal sinus rhythm Nonspecific ST and T wave abnormality Abnormal ECG No acute changes Confirmed by Drema Pry [phone]) on 11/03/2019 1:15:37 AM   Radiology DG Chest 2 View  Result Date: 11/02/2019 CLINICAL DATA:  Chest pain EXAM: CHEST - 2 VIEW COMPARISON:  08/10/2017 FINDINGS: No active cardiopulmonary disease. Tiny left pleural effusion and associated left basilar atelectasis has developed since prior examination. No superimposed focal pulmonary infiltrate. No pneumothorax. No pleural effusion on the right. Cardiac size is within normal limits. Pulmonary vascularity is normal. No acute bone abnormality IMPRESSION: Tiny left pleural effusion and associated left basilar atelectasis, new since prior examination. Electronically Signed   By: Helyn Numbers MD   On: 11/02/2019 15:15   CT Angio Chest PE W and/or  Wo Contrast  Result Date: 11/03/2019 CLINICAL DATA:  Pain under the left breast and shortness of breath for 4 days. History of hypertension. High probability for pulmonary embolus. EXAM: CT ANGIOGRAPHY CHEST WITH CONTRAST TECHNIQUE: Multidetector CT imaging of the chest was performed using the standard protocol during bolus administration of intravenous contrast. Multiplanar CT image reconstructions and MIPs were obtained to evaluate the vascular anatomy. CONTRAST:  94mL OMNIPAQUE IOHEXOL 350 MG/ML SOLN COMPARISON:  None. FINDINGS: Cardiovascular: Good opacification of the central and segmental pulmonary arteries. No  focal filling defects. No evidence of significant pulmonary embolus. Mild cardiac enlargement. Small pericardial effusion. Coronary artery and aortic calcifications. No aortic aneurysm. Mediastinum/Nodes: No enlarged mediastinal, hilar, or axillary lymph nodes. Thyroid gland, trachea, and esophagus demonstrate no significant findings. Lungs/Pleura: Small left pleural effusion. Atelectasis in the lung bases. Airways are patent. Upper Abdomen: No acute abnormality. Musculoskeletal: No chest wall abnormality. No acute or significant osseous findings. Review of the MIP images confirms the above findings. IMPRESSION: 1. No evidence of significant pulmonary embolus. 2. Mild cardiac enlargement with small pericardial effusion. 3. Small left pleural effusion with basilar atelectasis. 4. Aortic atherosclerosis. Aortic Atherosclerosis (ICD10-I70.0). Electronically Signed   By: Burman Nieves M.D.   On: 11/03/2019 04:12    Procedures Procedures (including critical care time)  Medications Ordered in ED Medications  fentaNYL (SUBLIMAZE) injection 50 mcg (50 mcg Intravenous Given 11/03/19 0327)  iohexol (OMNIPAQUE) 350 MG/ML injection 60 mL (60 mLs Intravenous Contrast Given 11/03/19 8003)    ED Course  I have reviewed the triage vital signs and the nursing notes.  Pertinent labs & imaging results that  were available during my care of the patient were reviewed by me and considered in my medical decision making (see chart for details).  Clinical Course as of Nov 03 543  Sun Nov 02, 2019  2240 ED EKG [RK]    Clinical Course User Index [RK] Melene Plan, MD   MDM Rules/Calculators/A&P                          Patient to ED with c/o sharp left chest pain located under her breast that is worse with deep breath. No other ss/sxs.   She is overall well appearing and in NAD. VSS. Pain is not reproducible - doubt chest wall pain. CXR does not show any consolidation or infiltrate - doubt acute pulmonary infection. She is COVID vaccinated. CXR does show a small left pleural effusion. EKG nonacute/nonischemic. Initial troponin negative. Delta troponin pending. IV Fentanyl given for pain.   CTA r/o PE done and again shows a small left pleural effusion. There is also mild cardiomegaly and a pericardial effusion of questionable significance.   6:25 - Delta troponin remains pending. Re-examination finds the patient feeling better after 50 mcg Fentanyl. VSS.   Plan at shift change: Review delta troponin. If negative she can be discharged with cardiology referral for outpatient follow up of pericardial effusion and chest pain.   Final Clinical Impression(s) / ED Diagnoses Final diagnoses:  None   1. Nonspecific chest pain 2. Pericardial effusion   Rx / DC Orders ED Discharge Orders    None       Danne Harbor 11/03/19 4917    Nira Conn, MD 11/03/19 [phone]

## 2019-11-03 NOTE — ED Notes (Signed)
Called lab RE troponin lab from 0445.

## 2019-11-25 ENCOUNTER — Other Ambulatory Visit (INDEPENDENT_AMBULATORY_CARE_PROVIDER_SITE_OTHER): Payer: Self-pay | Admitting: Primary Care

## 2019-11-25 DIAGNOSIS — Z760 Encounter for issue of repeat prescription: Secondary | ICD-10-CM

## 2020-01-12 ENCOUNTER — Other Ambulatory Visit (INDEPENDENT_AMBULATORY_CARE_PROVIDER_SITE_OTHER): Payer: Self-pay | Admitting: Primary Care

## 2020-01-12 DIAGNOSIS — Z760 Encounter for issue of repeat prescription: Secondary | ICD-10-CM

## 2020-01-12 NOTE — Telephone Encounter (Signed)
Sent to PCP ?

## 2020-02-25 ENCOUNTER — Other Ambulatory Visit (INDEPENDENT_AMBULATORY_CARE_PROVIDER_SITE_OTHER): Payer: Self-pay | Admitting: Primary Care

## 2020-02-25 DIAGNOSIS — F411 Generalized anxiety disorder: Secondary | ICD-10-CM

## 2020-02-26 NOTE — Telephone Encounter (Signed)
Requested medication (s) are due for refill today:  Yes  Requested medication (s) are on the active medication list:  Yes  Future visit scheduled:  No  Last Refill: 08/08/19; #90; RF x 1  Notes to clinic:  Attempted to contact pt.  Left vm to call and schedule a f/u appt.  Pt. was due to f/u in August; per protocol if >3 mos. Overdue, route to the Provider for review.   Requested Prescriptions  Pending Prescriptions Disp Refills   escitalopram (LEXAPRO) 20 MG tablet [Pharmacy Med Name: ESCITALOPRAM 20MG  TABLETS] 90 tablet 1    Sig: TAKE 1 TABLET(20 MG) BY MOUTH DAILY      Psychiatry:  Antidepressants - SSRI Failed - 02/25/2020  2:24 PM      Failed - Valid encounter within last 6 months    Recent Outpatient Visits           6 months ago Stage 3 chronic kidney disease, unspecified whether stage 3a or 3b CKD   [facility] 02/27/2020, NP   1 year ago Hypertension, unspecified type   [facility], NP   1 year ago Post-menopause   [facility], NP   1 year ago Medication refill   [facility], NP   1 year ago Anxiety   [facility] [place], [place], NP

## 2020-03-01 ENCOUNTER — Other Ambulatory Visit (INDEPENDENT_AMBULATORY_CARE_PROVIDER_SITE_OTHER): Payer: Self-pay | Admitting: Primary Care

## 2020-03-01 DIAGNOSIS — Z760 Encounter for issue of repeat prescription: Secondary | ICD-10-CM

## 2020-04-20 ENCOUNTER — Other Ambulatory Visit (INDEPENDENT_AMBULATORY_CARE_PROVIDER_SITE_OTHER): Payer: Self-pay | Admitting: Primary Care

## 2020-04-20 DIAGNOSIS — I10 Essential (primary) hypertension: Secondary | ICD-10-CM

## 2020-05-11 ENCOUNTER — Encounter (INDEPENDENT_AMBULATORY_CARE_PROVIDER_SITE_OTHER): Payer: Self-pay | Admitting: Primary Care

## 2020-05-11 ENCOUNTER — Ambulatory Visit (INDEPENDENT_AMBULATORY_CARE_PROVIDER_SITE_OTHER): Payer: Medicare (Managed Care) | Admitting: Primary Care

## 2020-05-11 ENCOUNTER — Other Ambulatory Visit: Payer: Self-pay

## 2020-05-11 VITALS — BP 153/74 | HR 69 | Temp 97.5°F | Ht 67.0 in | Wt 219.0 lb

## 2020-05-11 DIAGNOSIS — E782 Mixed hyperlipidemia: Secondary | ICD-10-CM | POA: Diagnosis not present

## 2020-05-11 DIAGNOSIS — G8929 Other chronic pain: Secondary | ICD-10-CM

## 2020-05-11 DIAGNOSIS — N898 Other specified noninflammatory disorders of vagina: Secondary | ICD-10-CM | POA: Diagnosis not present

## 2020-05-11 DIAGNOSIS — M25561 Pain in right knee: Secondary | ICD-10-CM | POA: Diagnosis not present

## 2020-05-11 DIAGNOSIS — M25562 Pain in left knee: Secondary | ICD-10-CM

## 2020-05-11 DIAGNOSIS — I10 Essential (primary) hypertension: Secondary | ICD-10-CM

## 2020-05-11 MED ORDER — HYDROXYZINE HCL 25 MG PO TABS: 25.0000 mg | ORAL_TABLET | Freq: Three times a day (TID) | ORAL | 3 refills | Status: DC | PRN

## 2020-05-11 MED ORDER — HYDROCHLOROTHIAZIDE 25 MG PO TABS: 25.0000 mg | ORAL_TABLET | Freq: Every day | ORAL | 1 refills | Status: DC

## 2020-05-11 MED ORDER — AMLODIPINE BESYLATE 10 MG PO TABS: 10.0000 mg | ORAL_TABLET | Freq: Every day | ORAL | 1 refills | Status: DC

## 2020-05-11 MED ORDER — FLUCONAZOLE 150 MG PO TABS: 150.0000 mg | ORAL_TABLET | Freq: Once | ORAL | 0 refills | Status: AC

## 2020-05-11 NOTE — Progress Notes (Unsigned)
Established Patient Office Visit  Subjective:  Patient ID: Kalanie Fewell, female    DOB: [birth year]  Age: 87 y.o. MRN: [id number]  CC:  Chief Complaint  Patient presents with  . Knee Pain    HPI Ashle Stief is a 87 year old obese female who presents for several concerns #1 vaginal itching without discharge, #2 frequent urination with out burning or incontinence,  #3 cough for 1 month productive phlegm clear .  Blood pressure is also elevated but states takes her medication daily . Denies shortness of breath, headaches, chest pain or lower extremity edema, sudden onset, vision changes, unilateral weakness, dizziness, paresthesias Past Medical History:  Diagnosis Date  . Arthritis   . Hyperlipidemia   . Hypertension     Past Surgical History:  Procedure Laterality Date  . APPENDECTOMY    . CHOLECYSTECTOMY    . THYROIDECTOMY, PARTIAL      Family History  Problem Relation Age of Onset  . Cancer Mother   . Cancer Sister     Social History   Socioeconomic History  . Marital status: Single    Spouse name: Not on file  . Number of children: Not on file  . Years of education: Not on file  . Highest education level: Not on file  Occupational History  . Not on file  Tobacco Use  . Smoking status: Former Smoker    Types: Cigarettes  . Smokeless tobacco: Never Used  Vaping Use  . Vaping Use: Never used  Substance and Sexual Activity  . Alcohol use: No  . Drug use: No  . Sexual activity: Not Currently  Other Topics Concern  . Not on file  Social History Narrative  . Not on file   Social Determinants of Health   Financial Resource Strain: Not on file  Food Insecurity: Not on file  Transportation Needs: Not on file  Physical Activity: Not on file  Stress: Not on file  Social Connections: Not on file  Intimate Partner Violence: Not on file    Outpatient Medications Prior to Visit  Medication Sig Dispense Refill  . atorvastatin (LIPITOR) 20 MG tablet TAKE 1  TABLET BY MOUTH EVERY DAY 90 tablet 1  . clonazePAM (KLONOPIN) 0.5 MG tablet TAKE 1 TABLET BY MOUTH EVERY DAY AS NEEDED FOR ANXIETY 60 tablet 0  . escitalopram (LEXAPRO) 20 MG tablet TAKE 1 TABLET(20 MG) BY MOUTH DAILY 90 tablet 1  . ezetimibe (ZETIA) 10 MG tablet TAKE 1 TABLET(10 MG) BY MOUTH DAILY 90 tablet 0  . amLODipine (NORVASC) 10 MG tablet TAKE 1 TABLET(10 MG) BY MOUTH DAILY 30 tablet 0  . hydrochlorothiazide (HYDRODIURIL) 25 MG tablet Take 1 tablet (25 mg total) by mouth daily. 90 tablet 1  . hydrOXYzine (ATARAX/VISTARIL) 25 MG tablet Take 1 tablet (25 mg total) by mouth every 8 (eight) hours as needed for anxiety. 60 tablet 3   No facility-administered medications prior to visit.    No Known Allergies  ROS Review of Systems  Genitourinary:       Groin itching  Musculoskeletal:       Knee pain  All other systems reviewed and are negative.     Objective:    Physical Exam Vitals reviewed.  Constitutional:      Appearance: She is obese.  HENT:     Head: Normocephalic.     Right Ear: Tympanic membrane and external ear normal.     Left Ear: Tympanic membrane and external ear normal.     Nose:  Nose normal.  Cardiovascular:     Rate and Rhythm: Normal rate and regular rhythm.  Pulmonary:     Effort: Pulmonary effort is normal.     Breath sounds: Normal breath sounds.  Musculoskeletal:        General: Normal range of motion.     Cervical back: Normal range of motion and neck supple.  Skin:    General: Skin is warm and dry.  Neurological:     Mental Status: She is oriented to person, place, and time.     BP (!) 153/74 (BP Location: Left Arm, Patient Position: Sitting, Cuff Size: Large)   Pulse 69   Temp (!) 97.5 F (36.4 C) (Temporal)   Ht _0  (1.702 m)   Wt 219 lb (99.3 kg)   SpO2 98%   BMI 34.30 kg/m  Wt Readings from Last 3 Encounters:  05/11/20 219 lb (99.3 kg)  08/08/19 210 lb 12.8 oz (95.6 kg)  01/03/19 211 lb 3.2 oz (95.8 kg)     Health  Maintenance Due  Topic Date Due  . COVID-19 Vaccine (1) Never done  . PNA vac Low Risk Adult (2 of 2 - PPSV23) 06/21/2018  . INFLUENZA VACCINE  Never done    There are no preventive care reminders to display for this patient.  Lab Results  Component Value Date   TSH 1.990 05/23/2017   Lab Results  Component Value Date   WBC 6.5 05/11/2020   HGB 13.5 05/11/2020   HCT 41.7 05/11/2020   MCV 91 05/11/2020   PLT 343 05/11/2020   Lab Results  Component Value Date   NA 142 05/11/2020   K 5.0 05/11/2020   CO2 24 05/11/2020   GLUCOSE 106 (H) 05/11/2020   BUN 11 05/11/2020   CREATININE 1.04 (H) 05/11/2020   BILITOT 0.3 05/11/2020   ALKPHOS 138 (H) 05/11/2020   AST 14 05/11/2020   ALT 7 05/11/2020   PROT 8.0 05/11/2020   ALBUMIN 4.3 05/11/2020   CALCIUM 10.1 05/11/2020   ANIONGAP 11 11/02/2019   Lab Results  Component Value Date   CHOL 171 05/11/2020   Lab Results  Component Value Date   HDL 53 05/11/2020   Lab Results  Component Value Date   LDLCALC 101 (H) 05/11/2020   Lab Results  Component Value Date   TRIG 94 05/11/2020   Lab Results  Component Value Date   CHOLHDL 3.2 05/11/2020   Lab Results  Component Value Date   HGBA1C 5.5 05/23/2017      Assessment & Plan:  Amazing was seen today for knee pain.  Diagnoses and all orders for this visit:  Mixed hyperlipidemia  decrease your fatty foods, red meat, cheese, milk and increase fiber like whole grains and veggies. You can also add a fiber supplement like Metamucil or Benefiber.  -     Lipid Panel  Hypertension, unspecified type Counseled on blood pressure goal of less than 140/90, low-sodium, DASH diet, medication compliance, 150 minutes of moderate intensity exercise per week. Discussed medication compliance, adverse effects. -     hydrochlorothiazide (HYDRODIURIL) 25 MG tablet; Take 1 tablet (25 mg total) by mouth daily. -     amLODipine (NORVASC) 10 MG tablet; Take 1 tablet (10 mg total) by mouth  daily. -     CMP14+EGFR -     CBC with Differential  Vaginal itching -     fluconazole (DIFLUCAN) 150 MG tablet; Take 1 tablet (150 mg total) by mouth once for  1 dose.  Chronic pain of both knees Work on losing weight to help reduce joint pain. May alternate with heat and ice application for pain relief. May also alternate with acetaminophen and Ibuprofen as prescribed pain relief. Other alternatives include massage, acupuncture and water aerobics.  You must stay active and avoid a sedentary lifestyle.  Other orders -     Discontinue: hydrOXYzine (ATARAX/VISTARIL) 25 MG tablet; Take 1 tablet (25 mg total) by mouth every 8 (eight) hours as needed for anxiety.   Follow-up: Return in about 5 weeks (around 06/15/2020) for Bp check .    Kerin Perna, NP

## 2020-05-11 NOTE — Progress Notes (Signed)
Pt complains of cough She also states she is urinating a lot

## 2020-05-11 NOTE — Patient Instructions (Signed)

## 2020-05-12 ENCOUNTER — Telehealth (INDEPENDENT_AMBULATORY_CARE_PROVIDER_SITE_OTHER): Payer: Self-pay

## 2020-05-12 ENCOUNTER — Other Ambulatory Visit (INDEPENDENT_AMBULATORY_CARE_PROVIDER_SITE_OTHER): Payer: Self-pay | Admitting: Primary Care

## 2020-05-12 DIAGNOSIS — F419 Anxiety disorder, unspecified: Secondary | ICD-10-CM

## 2020-05-12 LAB — LIPID PANEL
Chol/HDL Ratio: 3.2 ratio (ref 0.0–4.4)
Cholesterol, Total: 171 mg/dL (ref 100–199)
HDL: 53 mg/dL (ref >39)
LDL Chol Calc (NIH): 101 mg/dL — ABNORMAL HIGH (ref 0–99)
Triglycerides: 94 mg/dL (ref 0–149)
VLDL Cholesterol Cal: 17 mg/dL (ref 5–40)

## 2020-05-12 LAB — CBC WITH DIFFERENTIAL/PLATELET
Basophils Absolute: 0 10*3/uL (ref 0.0–0.2)
Basos: 1 %
EOS (ABSOLUTE): 0.1 10*3/uL (ref 0.0–0.4)
Eos: 1 %
Hematocrit: 41.7 % (ref 34.0–46.6)
Hemoglobin: 13.5 g/dL (ref 11.1–15.9)
Immature Grans (Abs): 0 10*3/uL (ref 0.0–0.1)
Immature Granulocytes: 0 %
Lymphocytes Absolute: 2.1 10*3/uL (ref 0.7–3.1)
Lymphs: 32 %
MCH: 29.6 pg (ref 26.6–33.0)
MCHC: 32.4 g/dL (ref 31.5–35.7)
MCV: 91 fL (ref 79–97)
Monocytes Absolute: 0.5 10*3/uL (ref 0.1–0.9)
Monocytes: 8 %
Neutrophils Absolute: 3.7 10*3/uL (ref 1.4–7.0)
Neutrophils: 58 %
Platelets: 343 10*3/uL (ref 150–450)
RBC: 4.56 x10E6/uL (ref 3.77–5.28)
RDW: 14.1 % (ref 11.7–15.4)
WBC: 6.5 10*3/uL (ref 3.4–10.8)

## 2020-05-12 LAB — CMP14+EGFR
ALT: 7 IU/L (ref 0–32)
AST: 14 IU/L (ref 0–40)
Albumin/Globulin Ratio: 1.2 (ref 1.2–2.2)
Albumin: 4.3 g/dL (ref 3.6–4.6)
Alkaline Phosphatase: 138 IU/L — ABNORMAL HIGH (ref 44–121)
BUN/Creatinine Ratio: 11 — ABNORMAL LOW (ref 12–28)
BUN: 11 mg/dL (ref 8–27)
Bilirubin Total: 0.3 mg/dL (ref 0.0–1.2)
CO2: 24 mmol/L (ref 20–29)
Calcium: 10.1 mg/dL (ref 8.7–10.3)
Chloride: 102 mmol/L (ref 96–106)
Creatinine, Ser: 1.04 mg/dL — ABNORMAL HIGH (ref 0.57–1.00)
Globulin, Total: 3.7 g/dL (ref 1.5–4.5)
Glucose: 106 mg/dL — ABNORMAL HIGH (ref 65–99)
Potassium: 5 mmol/L (ref 3.5–5.2)
Sodium: 142 mmol/L (ref 134–144)
Total Protein: 8 g/dL (ref 6.0–8.5)
eGFR: 53 mL/min/{1.73_m2} — ABNORMAL LOW (ref >59)

## 2020-05-12 MED ORDER — HYDROXYZINE HCL 25 MG PO TABS: 25.0000 mg | ORAL_TABLET | Freq: Three times a day (TID) | ORAL | 3 refills | Status: DC | PRN

## 2020-05-12 NOTE — Telephone Encounter (Signed)
-----   Message from Grayce Sessions, NP sent at 05/12/2020  1:54 PM EDT ----- Labs are normal continue cholesterol medication

## 2020-05-12 NOTE — Telephone Encounter (Signed)
Patient is aware that labs are normal and to continue taking cholesterol medication. Leah Anderson, CMA

## 2020-05-20 ENCOUNTER — Other Ambulatory Visit (INDEPENDENT_AMBULATORY_CARE_PROVIDER_SITE_OTHER): Payer: Self-pay | Admitting: Primary Care

## 2020-05-20 DIAGNOSIS — Z760 Encounter for issue of repeat prescription: Secondary | ICD-10-CM

## 2020-05-20 NOTE — Telephone Encounter (Signed)
Requested Prescriptions  Pending Prescriptions Disp Refills  . ezetimibe (ZETIA) 10 MG tablet [Pharmacy Med Name: EZETIMIBE 10MG  TABLETS] 90 tablet 3    Sig: TAKE 1 TABLET(10 MG) BY MOUTH DAILY     Cardiovascular:  Antilipid - Sterol Transport Inhibitors Failed - 05/20/2020  6:34 AM      Failed - LDL in normal range and within 360 days    LDL Chol Calc (NIH)  Date Value Ref Range Status  05/11/2020 101 (H) 0 - 99 mg/dL Final         Passed - Total Cholesterol in normal range and within 360 days    Cholesterol, Total  Date Value Ref Range Status  05/11/2020 171 100 - 199 mg/dL Final         Passed - HDL in normal range and within 360 days    HDL  Date Value Ref Range Status  05/11/2020 53 >39 mg/dL Final         Passed - Triglycerides in normal range and within 360 days    Triglycerides  Date Value Ref Range Status  05/11/2020 94 0 - 149 mg/dL Final         Passed - Valid encounter within last 12 months    Recent Outpatient Visits          1 week ago Mixed hyperlipidemia   [facility] 05/13/2020 P, NP   9 months ago Stage 3 chronic kidney disease, unspecified whether stage 3a or 3b CKD   [facility] Gwinda Passe, NP   1 year ago Hypertension, unspecified type   [facility], NP   1 year ago Post-menopause   [facility], NP   1 year ago Medication refill   [facility], NP      Future Appointments            In 3 weeks Grayce Sessions, Randa Evens, NP [facility]

## 2020-06-02 ENCOUNTER — Other Ambulatory Visit (INDEPENDENT_AMBULATORY_CARE_PROVIDER_SITE_OTHER): Payer: Self-pay | Admitting: Primary Care

## 2020-06-02 DIAGNOSIS — I10 Essential (primary) hypertension: Secondary | ICD-10-CM

## 2020-06-15 ENCOUNTER — Ambulatory Visit (INDEPENDENT_AMBULATORY_CARE_PROVIDER_SITE_OTHER): Payer: Medicare (Managed Care) | Admitting: Primary Care

## 2020-08-26 ENCOUNTER — Other Ambulatory Visit (INDEPENDENT_AMBULATORY_CARE_PROVIDER_SITE_OTHER): Payer: Self-pay | Admitting: Primary Care

## 2020-08-26 DIAGNOSIS — Z760 Encounter for issue of repeat prescription: Secondary | ICD-10-CM

## 2020-08-26 DIAGNOSIS — F411 Generalized anxiety disorder: Secondary | ICD-10-CM

## 2020-08-26 NOTE — Telephone Encounter (Signed)
Requested medications are due for refill today two out of three  Requested medications are on the active medication list yes  Last refill See note to clinic  Last visit 04/2020  Future visit scheduled no  Notes to clinic Was supposed to return in 5 weeks and was a no show, she does not have an upcoming appt. Anxiety not addressed at last office visit, prior visit was 07/2019. Noted that Zetia has not been filled in over a year. Please assess.

## 2020-08-27 ENCOUNTER — Other Ambulatory Visit (INDEPENDENT_AMBULATORY_CARE_PROVIDER_SITE_OTHER): Payer: Self-pay | Admitting: Primary Care

## 2020-08-27 DIAGNOSIS — I10 Essential (primary) hypertension: Secondary | ICD-10-CM

## 2020-08-27 MED ORDER — AMLODIPINE BESYLATE 10 MG PO TABS: 10.0000 mg | ORAL_TABLET | Freq: Every day | ORAL | 1 refills | Status: DC

## 2020-10-04 ENCOUNTER — Telehealth (INDEPENDENT_AMBULATORY_CARE_PROVIDER_SITE_OTHER): Payer: Self-pay

## 2020-10-04 DIAGNOSIS — K0889 Other specified disorders of teeth and supporting structures: Secondary | ICD-10-CM

## 2020-10-04 NOTE — Telephone Encounter (Signed)
Patient requested appointment with PCP when advised PCP was not in the office but could be seen by a different Provider patient decline appointment and preferred to send a messaged.  Patient would a urgent referral to a dentistry for tooth pain. Patient states " Back tooth that is very loose need to be referred to a dentist ASAP " .   Please advise patient has [employer]

## 2020-10-08 NOTE — Telephone Encounter (Signed)
Referral has been placed. 

## 2020-10-08 NOTE — Addendum Note (Signed)
Addended byHoy Register on: 10/08/2020 01:18 PM   Modules accepted: Orders

## 2020-11-20 ENCOUNTER — Ambulatory Visit (INDEPENDENT_AMBULATORY_CARE_PROVIDER_SITE_OTHER): Payer: Medicare (Managed Care) | Admitting: Primary Care

## 2020-11-21 ENCOUNTER — Other Ambulatory Visit (INDEPENDENT_AMBULATORY_CARE_PROVIDER_SITE_OTHER): Payer: Self-pay | Admitting: Primary Care

## 2020-11-21 DIAGNOSIS — I10 Essential (primary) hypertension: Secondary | ICD-10-CM

## 2020-11-22 ENCOUNTER — Other Ambulatory Visit (INDEPENDENT_AMBULATORY_CARE_PROVIDER_SITE_OTHER): Payer: Self-pay | Admitting: Primary Care

## 2020-11-22 DIAGNOSIS — I10 Essential (primary) hypertension: Secondary | ICD-10-CM

## 2021-02-25 ENCOUNTER — Other Ambulatory Visit (INDEPENDENT_AMBULATORY_CARE_PROVIDER_SITE_OTHER): Payer: Self-pay | Admitting: Primary Care

## 2021-02-25 DIAGNOSIS — F411 Generalized anxiety disorder: Secondary | ICD-10-CM

## 2021-02-25 NOTE — Telephone Encounter (Signed)
Pt called, states she doesn't see Marcelino Duster anymore and has another dr. This request will be refused.   Requested Prescriptions  Pending Prescriptions Disp Refills   escitalopram (LEXAPRO) 20 MG tablet [Pharmacy Med Name: ESCITALOPRAM 20MG  TABLETS] 90 tablet 1    Sig: TAKE 1 TABLET(20 MG) BY MOUTH DAILY     Psychiatry:  Antidepressants - SSRI Failed - 02/25/2021  1:41 PM      Failed - Valid encounter within last 6 months    Recent Outpatient Visits           9 months ago Mixed hyperlipidemia   [facility] 02/27/2021, NP   1 year ago Stage 3 chronic kidney disease, unspecified whether stage 3a or 3b CKD   [facility] Grayce Sessions, NP   2 years ago Hypertension, unspecified type   [facility], NP   2 years ago Post-menopause   [facility], NP   2 years ago Medication refill   [facility], NP

## 2021-05-06 ENCOUNTER — Ambulatory Visit (INDEPENDENT_AMBULATORY_CARE_PROVIDER_SITE_OTHER): Payer: Medicare (Managed Care)

## 2021-05-06 ENCOUNTER — Other Ambulatory Visit: Payer: Self-pay

## 2021-05-12 IMAGING — DX DG CHEST 2V
2 series · 2 of 2 positions shown · non-contrast
Comparison: 08/10/2017

CLINICAL DATA: Chest pain

EXAM:
CHEST - 2 VIEW

[chest pa]
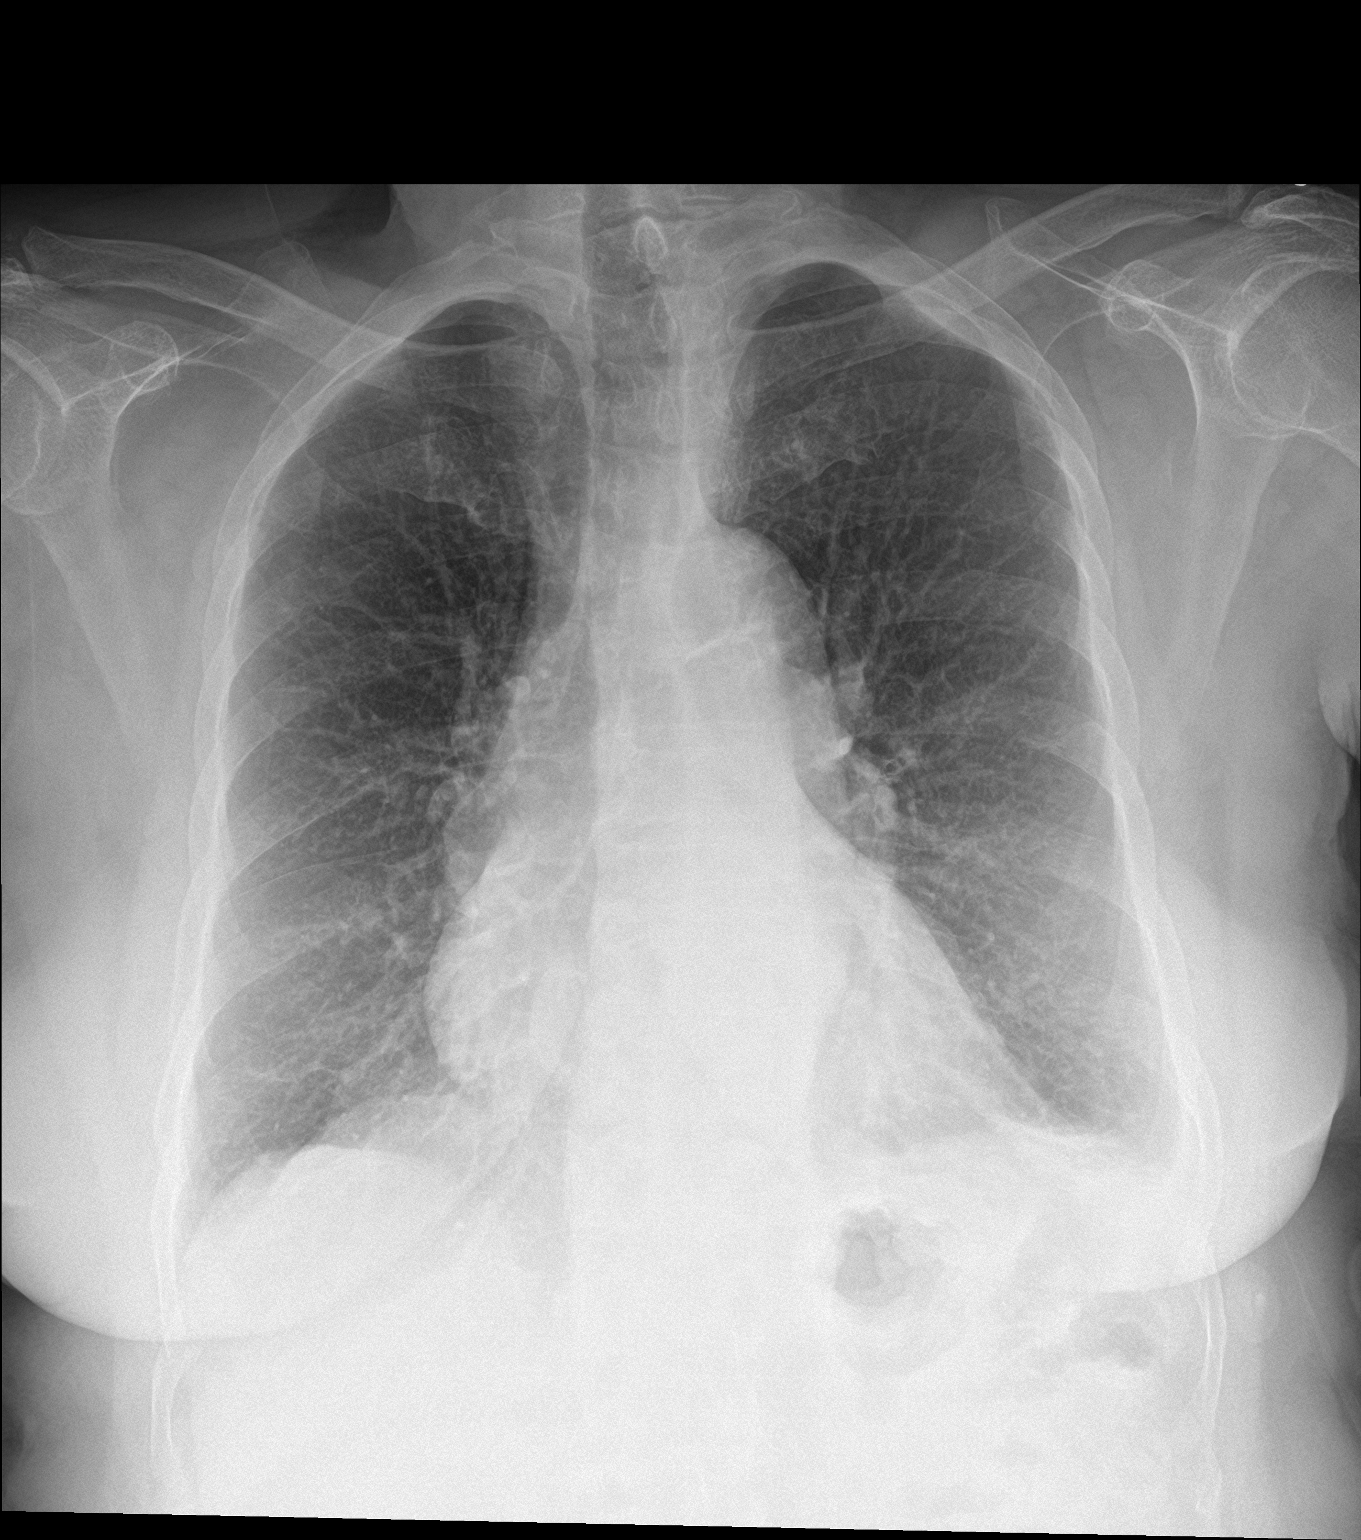

[chest lat]
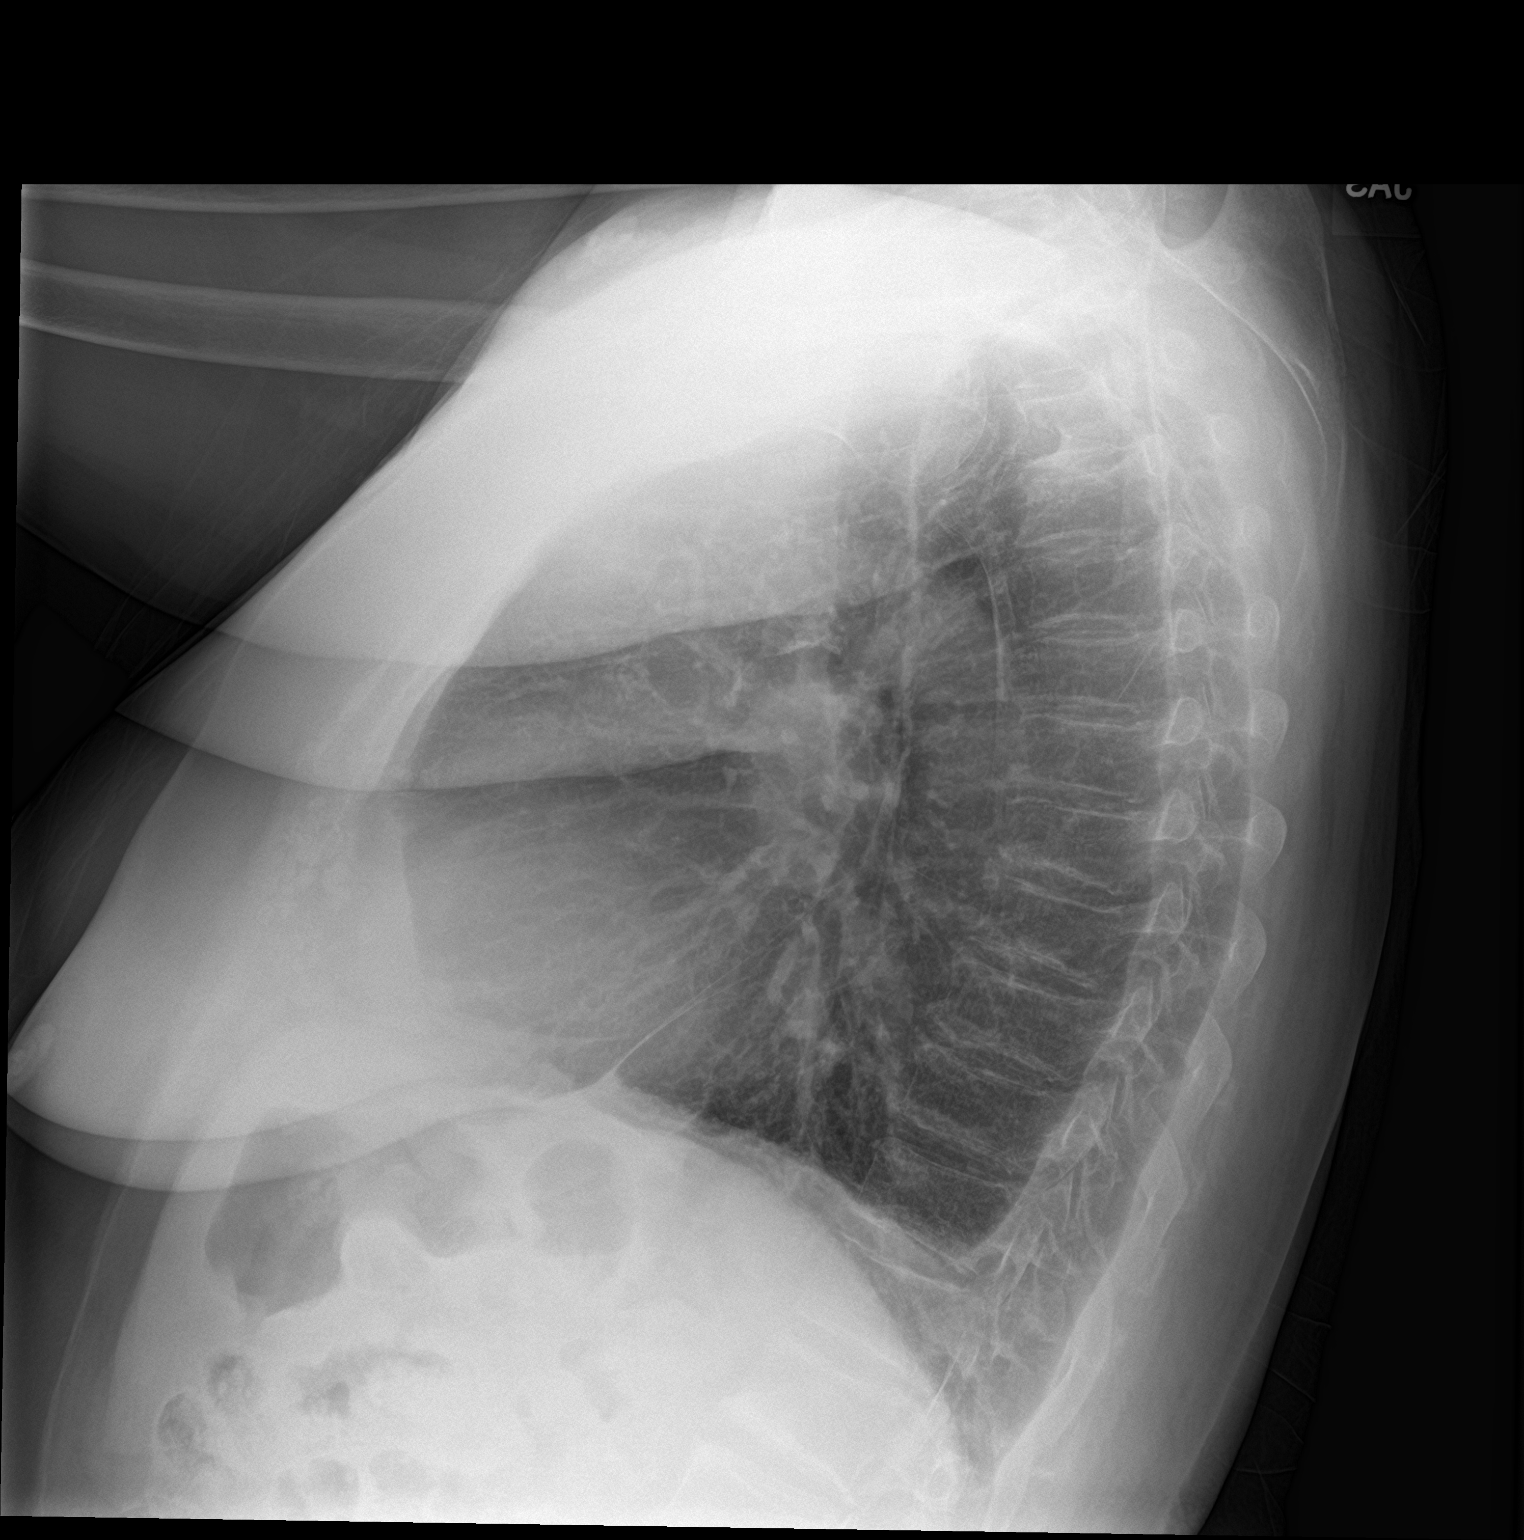

[2 of 2 positions shown; findings below may reference images not displayed]

FINDINGS: No active cardiopulmonary disease. Tiny left pleural effusion and
associated left basilar atelectasis has developed since prior
examination. No superimposed focal pulmonary infiltrate. No
pneumothorax. No pleural effusion on the right. Cardiac size is
within normal limits. Pulmonary vascularity is normal. No acute bone
abnormality
IMPRESSION: Tiny left pleural effusion and associated left basilar atelectasis,
new since prior examination.

## 2021-05-13 IMAGING — CT CT ANGIO CHEST
2 of 7 series · 18 of 46 positions shown · IV contrast (APPLIED)
Comparison: None.

CLINICAL DATA: Pain under the left breast and shortness of breath
for 4 days. History of hypertension. High probability for pulmonary
embolus.

EXAM:
CT ANGIOGRAPHY CHEST WITH CONTRAST
TECHNIQUE: Multidetector CT imaging of the chest was performed using the
standard protocol during bolus administration of intravenous
contrast. Multiplanar CT image reconstructions and MIPs were
obtained to evaluate the vascular anatomy.
CONTRAST:  60mL OMNIPAQUE IOHEXOL 350 MG/ML SOLN

[Series 7: thins · axial · 0.74mm/px · z∈[-89,+117]mm · 15 of 333 slices shown]
[im 19/333  lung]
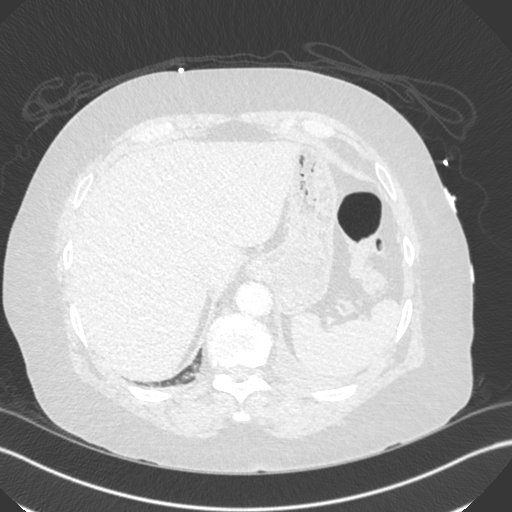
[im 37/333  soft-tissue]
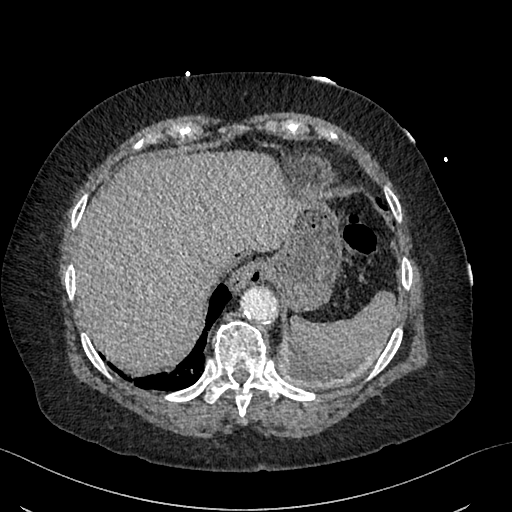
[im 56/333  lung]
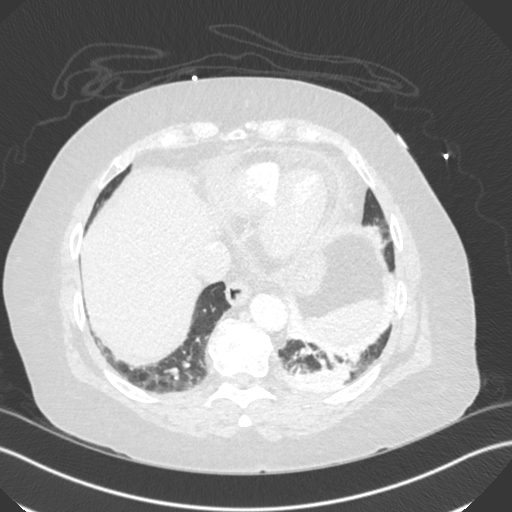
[im 74/333  soft-tissue]
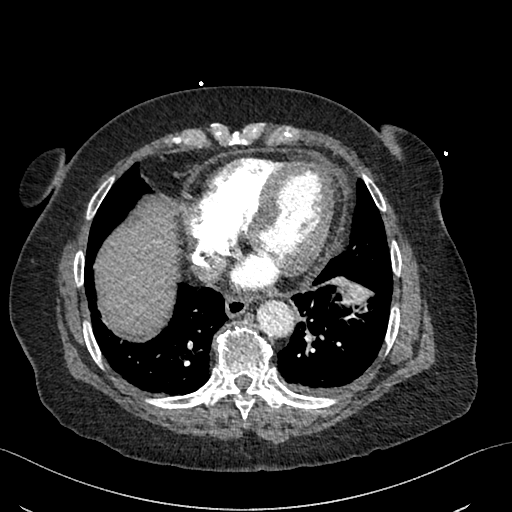
[im 111/333  lung]
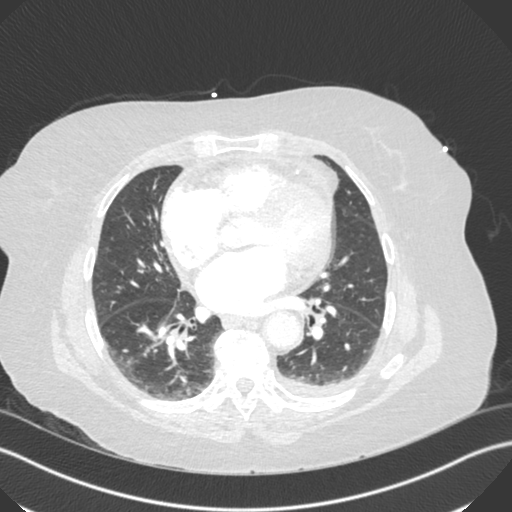
[im 130/333  soft-tissue]
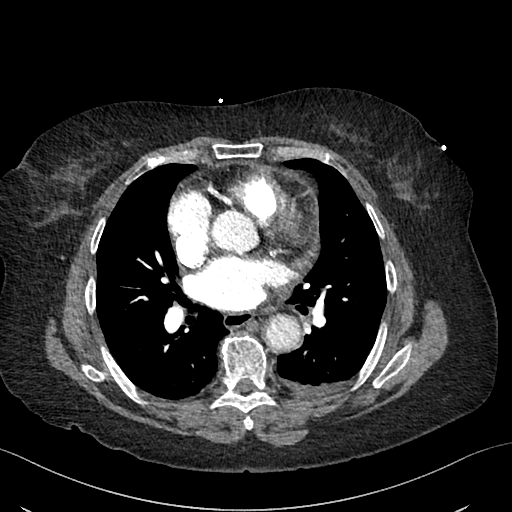
[im 148/333  lung]
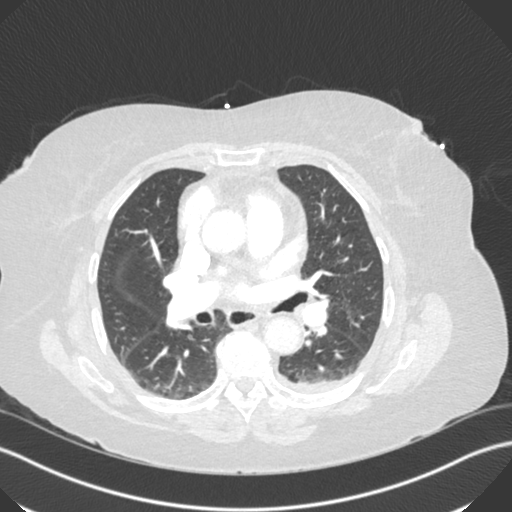
[im 167/333  soft-tissue]
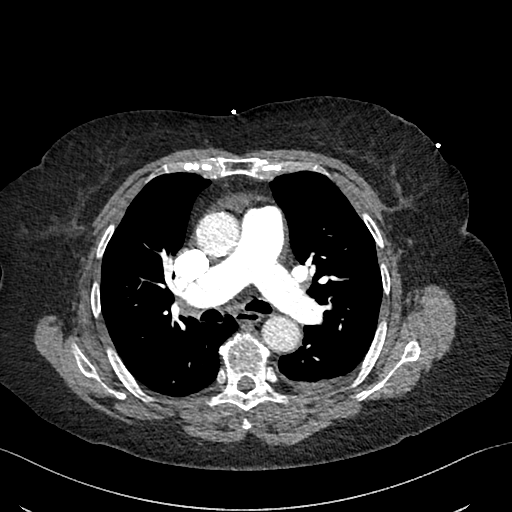
[im 185/333  lung]
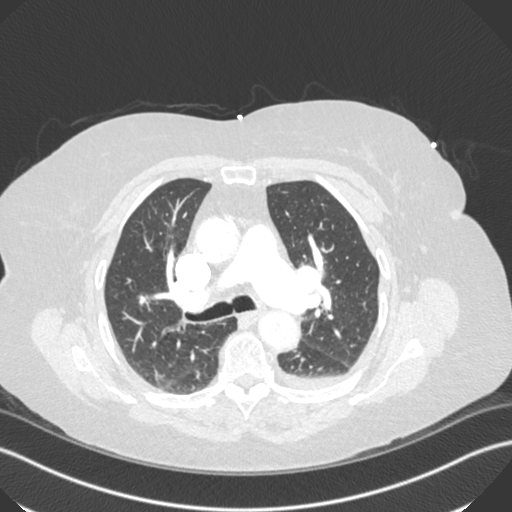
[im 203/333  soft-tissue]
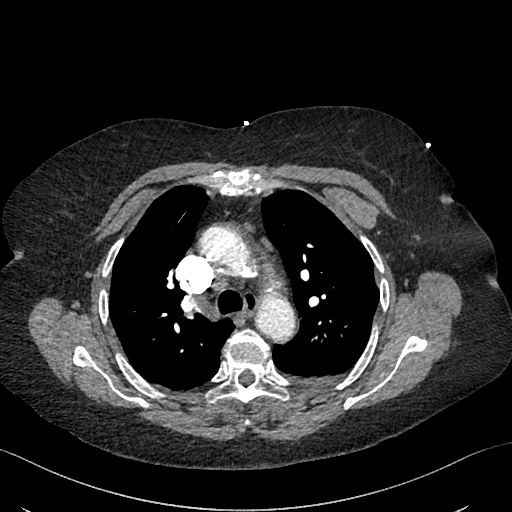
[im 222/333  lung]
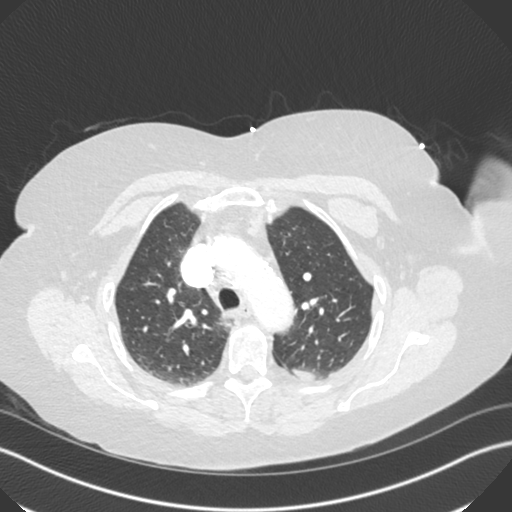
[im 259/333  soft-tissue]
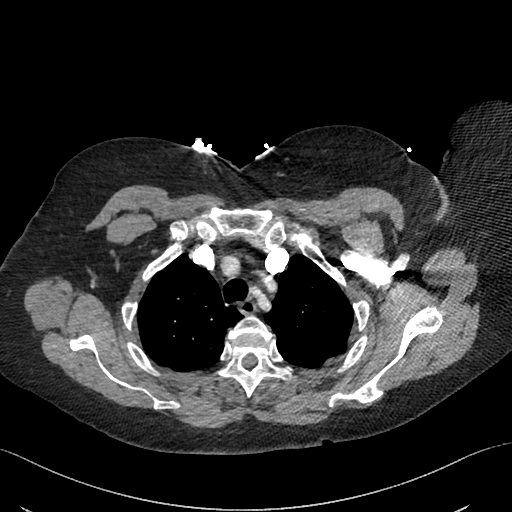
[im 277/333  lung]
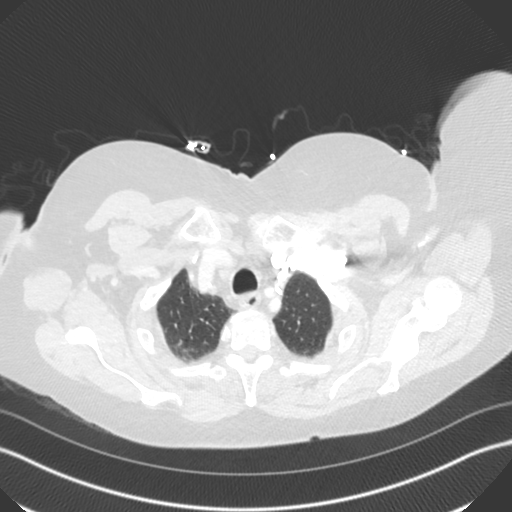
[im 296/333  soft-tissue]
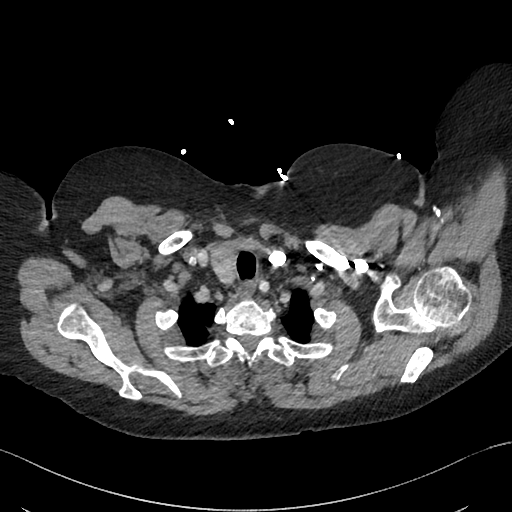
[im 314/333  lung]
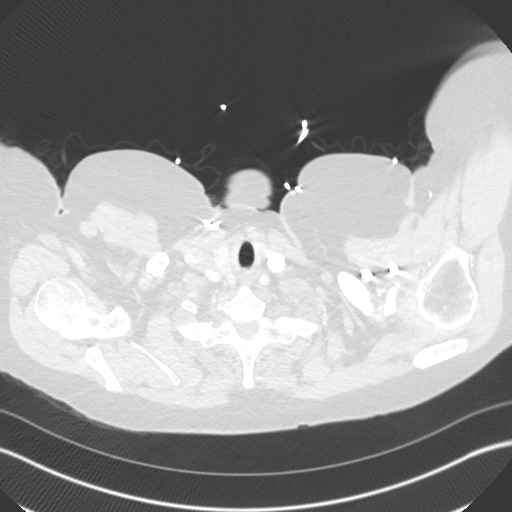

[Series 8: cor · coronal · 0.47mm/px · 3 of 136 slices shown]
[im 34/136  soft-tissue]
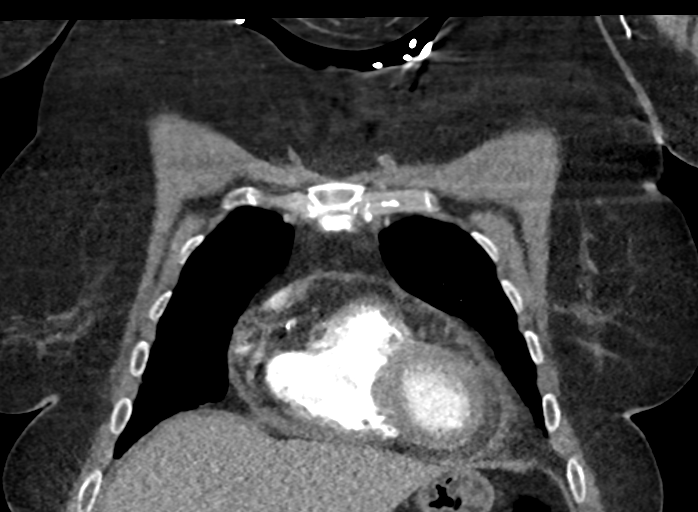
[im 68/136  soft-tissue]
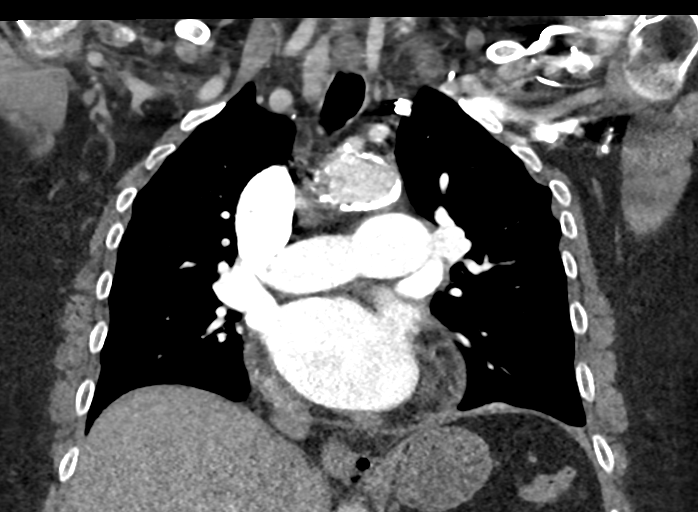
[im 102/136  soft-tissue]
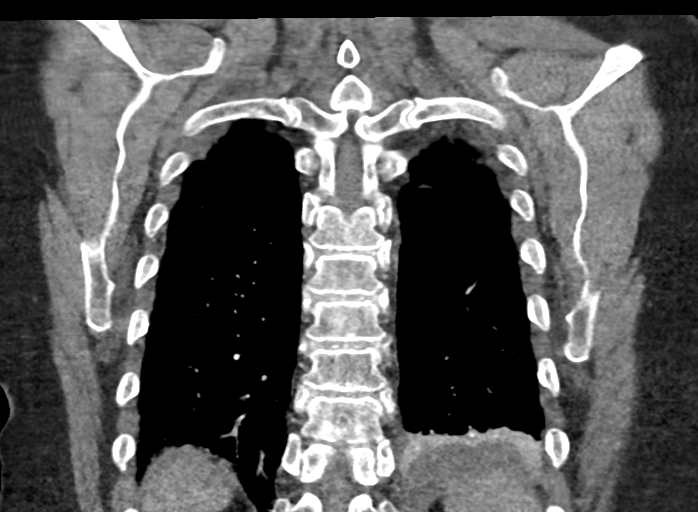

[18 of 46 positions shown; findings below may reference images not displayed]

FINDINGS: Cardiovascular: Good opacification of the central and segmental
pulmonary arteries. No focal filling defects. No evidence of
significant pulmonary embolus. Mild cardiac enlargement. Small
pericardial effusion. Coronary artery and aortic calcifications. No
aortic aneurysm.

Mediastinum/Nodes: No enlarged mediastinal, hilar, or axillary lymph
nodes. Thyroid gland, trachea, and esophagus demonstrate no
significant findings.

Lungs/Pleura: Small left pleural effusion. Atelectasis in the lung
bases. Airways are patent.

Upper Abdomen: No acute abnormality.

Musculoskeletal: No chest wall abnormality. No acute or significant
osseous findings.

Review of the MIP images confirms the above findings.
IMPRESSION: 1. No evidence of significant pulmonary embolus.
2. Mild cardiac enlargement with small pericardial effusion.
3. Small left pleural effusion with basilar atelectasis.
4. Aortic atherosclerosis.

Aortic Atherosclerosis ([id number]).

## 2021-06-17 ENCOUNTER — Ambulatory Visit: Payer: Medicare (Managed Care)

## 2021-06-24 ENCOUNTER — Ambulatory Visit: Payer: Medicare (Managed Care)

## 2021-07-01 ENCOUNTER — Ambulatory Visit: Payer: Medicare (Managed Care)

## 2021-10-28 ENCOUNTER — Telehealth: Payer: Self-pay

## 2021-10-28 ENCOUNTER — Ambulatory Visit (INDEPENDENT_AMBULATORY_CARE_PROVIDER_SITE_OTHER): Payer: Medicare (Managed Care)

## 2021-10-28 NOTE — Telephone Encounter (Addendum)
Attempted to call pt once at scheduled appointment time, also tried 10 mins after to complete AWV. Unable to reach pt, left patient vm to give her home office a call to reschedule at her earliest convenience.

## 2021-10-28 NOTE — Telephone Encounter (Signed)
Attempted to call pt to do AWV for today. I was unable to leave a vm YL,RMA

## 2022-05-04 ENCOUNTER — Other Ambulatory Visit: Payer: Self-pay | Admitting: Student

## 2022-05-04 DIAGNOSIS — E2839 Other primary ovarian failure: Secondary | ICD-10-CM

## 2022-11-01 ENCOUNTER — Inpatient Hospital Stay: Admission: RE | Admit: 2022-11-01 | Payer: Medicare (Managed Care) | Source: Ambulatory Visit

## 2023-12-20 ENCOUNTER — Emergency Department (HOSPITAL_COMMUNITY)

## 2023-12-20 ENCOUNTER — Emergency Department (HOSPITAL_COMMUNITY)
Admission: EM | Admit: 2023-12-20 | Discharge: 2023-12-21 | Disposition: A | Discharge status: Home / Self Care | Source: Ambulatory Visit

## 2023-12-20 DIAGNOSIS — J449 Chronic obstructive pulmonary disease, unspecified: Secondary | ICD-10-CM | POA: Insufficient documentation

## 2023-12-20 DIAGNOSIS — I509 Heart failure, unspecified: Secondary | ICD-10-CM | POA: Insufficient documentation

## 2023-12-20 DIAGNOSIS — R051 Acute cough: Secondary | ICD-10-CM

## 2023-12-20 DIAGNOSIS — R059 Cough, unspecified: Secondary | ICD-10-CM | POA: Diagnosis not present

## 2023-12-20 DIAGNOSIS — Z79899 Other long term (current) drug therapy: Secondary | ICD-10-CM | POA: Insufficient documentation

## 2023-12-20 DIAGNOSIS — R0602 Shortness of breath: Secondary | ICD-10-CM | POA: Insufficient documentation

## 2023-12-20 DIAGNOSIS — I110 Hypertensive heart disease with heart failure: Secondary | ICD-10-CM | POA: Insufficient documentation

## 2023-12-20 LAB — RESP PANEL BY RT-PCR (RSV, FLU A&B, COVID)  RVPGX2
Influenza A by PCR: NEGATIVE
Influenza B by PCR: NEGATIVE
Resp Syncytial Virus by PCR: NEGATIVE
SARS Coronavirus 2 by RT PCR: NEGATIVE

## 2023-12-20 LAB — CBC
HCT: 42 % (ref 36.0–46.0)
Hemoglobin: 13.2 g/dL (ref 12.0–15.0)
MCH: 28.6 pg (ref 26.0–34.0)
MCHC: 31.4 g/dL (ref 30.0–36.0)
MCV: 90.9 fL (ref 80.0–100.0)
Platelets: 315 K/uL (ref 150–400)
RBC: 4.62 MIL/uL (ref 3.87–5.11)
RDW: 14.6 % (ref 11.5–15.5)
WBC: 6.8 K/uL (ref 4.0–10.5)
nRBC: 0 % (ref 0.0–0.2)

## 2023-12-20 LAB — COMPREHENSIVE METABOLIC PANEL WITH GFR
ALT: 11 U/L (ref 0–44)
AST: 22 U/L (ref 15–41)
Albumin: 3.9 g/dL (ref 3.5–5.0)
Alkaline Phosphatase: 139 U/L — ABNORMAL HIGH (ref 38–126)
Anion gap: 11 (ref 5–15)
BUN: 13 mg/dL (ref 8–23)
CO2: 28 mmol/L (ref 22–32)
Calcium: 9.8 mg/dL (ref 8.9–10.3)
Chloride: 103 mmol/L (ref 98–111)
Creatinine, Ser: 1 mg/dL (ref 0.44–1.00)
GFR, Estimated: 54 mL/min — ABNORMAL LOW (ref >60)
Glucose, Bld: 182 mg/dL — ABNORMAL HIGH (ref 70–99)
Potassium: 3.3 mmol/L — ABNORMAL LOW (ref 3.5–5.1)
Sodium: 141 mmol/L (ref 135–145)
Total Bilirubin: 0.6 mg/dL (ref 0.0–1.2)
Total Protein: 8.1 g/dL (ref 6.5–8.1)

## 2023-12-20 LAB — PRO BRAIN NATRIURETIC PEPTIDE: Pro Brain Natriuretic Peptide: 297 pg/mL (ref <300.0)

## 2023-12-20 LAB — LIPASE, BLOOD: Lipase: <10 U/L — ABNORMAL LOW (ref 11–51)

## 2023-12-20 LAB — D-DIMER, QUANTITATIVE: D-Dimer, Quant: 3.3 ug{FEU}/mL — ABNORMAL HIGH (ref 0.00–0.50)

## 2023-12-20 LAB — TROPONIN T, HIGH SENSITIVITY: Troponin T High Sensitivity: <15 ng/L (ref 0–19)

## 2023-12-20 NOTE — ED Provider Notes (Signed)
 [place] EMERGENCY DEPARTMENT AT [facility] Provider Note   CSN: [id number] Arrival date & time: 12/20/23  8361     Patient presents with: No chief complaint on file.   Leah Anderson is a 89 y.o. female.   This is an 89 year old female presenting emergency department with a chief complaint of shortness of breath and cough.  Reports cough for close to 2 weeks, she worsening shortness of breath and dyspnea on exertion for the past week.  States she gets quite lightheaded and tired with ambulation.  Denies history of lung or heart disease.  No history of PE and low risk by Wells criteria.  Denies lower extremity edema, orthopnea.  No other URI symptoms.  Went to urgent care and was sent to the emergency department for further evaluation.        Prior to Admission medications   Medication Sig Start Date End Date Taking? Authorizing Provider  amLODipine  (NORVASC ) 10 MG tablet TAKE 1 TABLET(10 MG) BY MOUTH DAILY 11/22/20   Celestia Rosaline SQUIBB, NP  atorvastatin  (LIPITOR) 20 MG tablet TAKE 1 TABLET BY MOUTH EVERY DAY 08/27/20   Celestia Rosaline SQUIBB, NP  clonazePAM  (KLONOPIN ) 0.5 MG tablet TAKE 1 TABLET BY MOUTH EVERY DAY AS NEEDED FOR ANXIETY 08/08/19   Celestia Rosaline SQUIBB, NP  escitalopram  (LEXAPRO ) 20 MG tablet TAKE 1 TABLET(20 MG) BY MOUTH DAILY 08/27/20   Celestia Rosaline SQUIBB, NP  ezetimibe  (ZETIA ) 10 MG tablet TAKE 1 TABLET(10 MG) BY MOUTH DAILY 08/27/20   Celestia Rosaline SQUIBB, NP  hydrochlorothiazide  (HYDRODIURIL ) 25 MG tablet Take 1 tablet (25 mg total) by mouth daily. 05/11/20   Celestia Rosaline SQUIBB, NP  hydrOXYzine  (ATARAX /VISTARIL ) 25 MG tablet Take 1 tablet (25 mg total) by mouth every 8 (eight) hours as needed for anxiety. 05/12/20   Celestia Rosaline SQUIBB, NP    Allergies: Patient has no known allergies.    Review of Systems  Updated Vital Signs BP (!) 150/71   Pulse 74   Temp 98.7 F (37.1 C)   Resp 18   SpO2 97%   Physical Exam Vitals and nursing note  reviewed.  Constitutional:      General: She is not in acute distress.    Appearance: She is not toxic-appearing.  HENT:     Head: Normocephalic.     Nose: Nose normal.     Mouth/Throat:     Mouth: Mucous membranes are moist.  Eyes:     Conjunctiva/sclera: Conjunctivae normal.  Cardiovascular:     Rate and Rhythm: Normal rate and regular rhythm.  Pulmonary:     Effort: Pulmonary effort is normal.     Breath sounds: Normal breath sounds.  Abdominal:     General: Abdomen is flat. There is no distension.     Tenderness: There is no abdominal tenderness. There is no guarding or rebound.  Musculoskeletal:        General: Normal range of motion.     Right lower leg: No edema.     Left lower leg: No edema.  Skin:    General: Skin is warm.     Capillary Refill: Capillary refill takes less than 2 seconds.  Neurological:     Mental Status: She is alert and oriented to person, place, and time.  Psychiatric:        Mood and Affect: Mood normal.        Behavior: Behavior normal.     (all labs ordered are listed, but only abnormal results  are displayed) Labs Reviewed  COMPREHENSIVE METABOLIC PANEL WITH GFR - Abnormal; Notable for the following components:      Result Value   Potassium 3.3 (*)    Glucose, Bld 182 (*)    Alkaline Phosphatase 139 (*)    GFR, Estimated 54 (*)    All other components within normal limits  LIPASE, BLOOD - Abnormal; Notable for the following components:   Lipase <10 (*)    All other components within normal limits  D-DIMER, QUANTITATIVE - Abnormal; Notable for the following components:   D-Dimer, Quant 3.30 (*)    All other components within normal limits  RESP PANEL BY RT-PCR (RSV, FLU A&B, COVID)  RVPGX2  CBC  PRO BRAIN NATRIURETIC PEPTIDE  TROPONIN T, HIGH SENSITIVITY  TROPONIN T, HIGH SENSITIVITY    EKG: None  Radiology: DG Chest 2 View Result Date: 12/20/2023 CLINICAL DATA:  cough EXAM: CHEST - 2 VIEW COMPARISON:  11/03/2019 FINDINGS:  Emphysema. No focal airspace consolidation, pleural effusion, or pneumothorax. No cardiomegaly.Tortuous aorta with aortic atherosclerosis.No acute fracture or destructive lesion. Osteopenia. Multilevel thoracic osteophytosis. IMPRESSION: No acute cardiopulmonary abnormality. Electronically Signed   By: Rogelia Myers M.D.   On: 12/20/2023 17:21     Procedures   Medications Ordered in the ED - No data to display                                  Medical Decision Making This is a well-appearing 89 year old female medical history include hypertension, hyperlipidemia presents emergency department with 4 shortness of breath and cough.  She is afebrile nontachycardic, hypertensive maintaining oxygen saturation on room air.  Does not appear to be in overt distress.  No history of cardiac disease, but cannot find record of provocative cardiac testing.  Her history of dyspnea on exertion and shortness of breath concerning for possible cardiac etiology, however EKG without ischemic changes.  Troponin negative.  BNP not consistent with acute decompensated heart failure.  Her chest x-ray is clear without pneumonia or pneumothorax.  She has no significant metabolic derangements.  No leukocytosis to suggest infectious process.  Flu/COVID/RSV was negative.  Could not exclude PE therefore D-dimer ordered.  It was elevated, subsequent CTA is pending.  Patient clinically is well-appearing.  Care signed out to overnight team disposition pending CTA  Amount and/or Complexity of Data Reviewed Labs: ordered. Radiology: ordered. ECG/medicine tests: ordered.        Final diagnoses:  None    ED Discharge Orders     None          Neysa Caron PARAS, DO 12/21/23 0001

## 2023-12-20 NOTE — ED Triage Notes (Signed)
 Patient BIB EMS from urgent care for cough. Cough over 1 month, chronic. Urgent Care called EMS for chest pain and shortness of breath. Per EMS patient has not complained of chest pain and shortness of breath. Patient states she just doesn't feel well. Patient has hx of CHF and COPD. No complaints of pain. Rates pain 0/10.      Per EMS:  160/70 70 18 100% RA CBG 129

## 2023-12-20 NOTE — ED Triage Notes (Signed)
  Patient BIB EMS from urgent care for cough. Cough over 1 month, chronic. Urgent Care called EMS for chest pain and shortness of breath. Per EMS patient has not complained of chest pain and shortness of breath. Patient states she just doesn't feel well. Patient has hx of CHF and COPD. No complaints of pain. Rates pain 0/10.          Per EMS:   160/70 70 18 100% RA CBG 129

## 2023-12-21 ENCOUNTER — Encounter (HOSPITAL_COMMUNITY): Payer: Self-pay

## 2023-12-21 MED ORDER — IOHEXOL 350 MG/ML SOLN
75.0000 mL | Freq: Once | INTRAVENOUS | Status: AC | PRN
  Administered 2023-12-21: 75 mL via INTRAVENOUS

## 2023-12-21 NOTE — ED Provider Notes (Signed)
  Provider Note MRN:  [id number]  Arrival date & time: 12/21/23    ED Course and Medical Decision Making  Assumed care of patient at sign-out or upon transfer.  Increased cough and shortness of breath with a positive D-dimer awaiting PE study.  Candidate for discharge if workup is reassuring.  6 AM update: CT reassuring, evidence of bronchitis but no signs of lobar pneumonia, no PE.  Patient well-appearing on my assessment with normal vitals, resting comfortably.  Appropriate for discharge.  Procedures  Final Clinical Impressions(s) / ED Diagnoses     ICD-10-CM   1. Shortness of breath  R06.02     2. Acute cough  R05.1       ED Discharge Orders     None         Discharge Instructions      You were evaluated in the Emergency Department and after careful evaluation, we did not find any emergent condition requiring admission or further testing in the hospital.  Your exam/testing today is overall reassuring.  CT scan did not show any emergencies.  Your symptoms may be related to a bronchitis.  Recommend follow-up with your primary care doctor.  Please return to the Emergency Department if you experience any worsening of your condition.   Thank you for allowing us  to be a part of your care.      Ozell HERO. Theadore, MD [facility] Emergency Medicine [facility] [email or web address]    Theadore Ozell HERO, MD 12/21/23 [phone]

## 2023-12-21 NOTE — Progress Notes (Signed)
 This patient has received 60 ml's of IV omni 350 (type of contrast) contrast extravasation into the Left AC during a ct angio chest for pe exam.  The exam was performed on (date) and (time): 12/21/23, 0040 hrs  Site / affected area assessed by Katheryn Axe RT(R)(CT), Ozell Keel RN  Provider contacted: Dr Ozell Poisson Provider contacted date/time: 12/21/23, 0045 hrs

## 2023-12-21 NOTE — ED Notes (Signed)
 Emergency Contact (Updated Number) Leah Anderson- Son  [phone]

## 2023-12-21 NOTE — Discharge Instructions (Signed)
 You were evaluated in the Emergency Department and after careful evaluation, we did not find any emergent condition requiring admission or further testing in the hospital.  Your exam/testing today is overall reassuring.  CT scan did not show any emergencies.  Your symptoms may be related to a bronchitis.  Recommend follow-up with your primary care doctor.  Please return to the Emergency Department if you experience any worsening of your condition.   Thank you for allowing us  to be a part of your care.

## 2024-01-01 ENCOUNTER — Ambulatory Visit: Payer: Self-pay

## 2024-01-01 NOTE — Telephone Encounter (Signed)
 FYI Only or Action Required?: FYI only for provider: appointment scheduled on 01/04/24.  Patient was last seen in primary care on Not established.  Called Nurse Triage reporting Abdominal Pain.  Symptoms began today.  Interventions attempted: Nothing.  Symptoms are: stable.  Triage Disposition: Information or Advice Only Call  Patient/caregiver understands and will follow disposition?: Yes Reason for Disposition  Health information question, no triage required and triager able to answer question  Answer Assessment - Initial Assessment Questions Patient's granddaughter on the line today, states patient has not been on top of health. States she's taking diabetes and blood pressure medication and unsure if she even has those issues. Granddaughter states patient stated she had a blood clot on her lung but does not think that's true as she was sent home from doctors office and not to hospital. Granddaughter unable to fully answer triage questions about patients abdominal pain. Advised ED for worsening symptoms. New patient appointment scheduled.  1. REASON FOR CALL: What is the main reason for your call? or How can I best help you?     Granddaughter looking to establish care for patient at a [place] office, does not like the care patient is receiving at current PCP office  2. SYMPTOMS : Do you have any symptoms?      Abdominal pain, cough  Protocols used: Information Only Call - No Triage-A-AH  Copied from CRM [id number]. Topic: Clinical - Red Word Triage >> Jan 01, 2024  3:20 PM Alfonso ORN wrote: Red Word that prompted transfer to Nurse Triage:  Pt granddaughter called on behalf of pt stomach pain , diarrhea , not been eating getting worse. Granddaughter states she is currently bent over on couch ( called to schedule new pcp appt)

## 2024-01-04 ENCOUNTER — Encounter: Payer: Self-pay | Admitting: Nurse Practitioner

## 2024-01-04 ENCOUNTER — Ambulatory Visit (INDEPENDENT_AMBULATORY_CARE_PROVIDER_SITE_OTHER): Admitting: Nurse Practitioner

## 2024-01-04 ENCOUNTER — Telehealth: Payer: Self-pay | Admitting: Nurse Practitioner

## 2024-01-04 VITALS — BP 132/60 | HR 80 | Temp 97.6°F | Ht 67.0 in | Wt 178.8 lb

## 2024-01-04 DIAGNOSIS — Z131 Encounter for screening for diabetes mellitus: Secondary | ICD-10-CM | POA: Diagnosis not present

## 2024-01-04 DIAGNOSIS — I10 Essential (primary) hypertension: Secondary | ICD-10-CM

## 2024-01-04 DIAGNOSIS — Q845 Enlarged and hypertrophic nails: Secondary | ICD-10-CM

## 2024-01-04 DIAGNOSIS — R0609 Other forms of dyspnea: Secondary | ICD-10-CM

## 2024-01-04 DIAGNOSIS — R1900 Intra-abdominal and pelvic swelling, mass and lump, unspecified site: Secondary | ICD-10-CM | POA: Diagnosis not present

## 2024-01-04 DIAGNOSIS — I517 Cardiomegaly: Secondary | ICD-10-CM

## 2024-01-04 DIAGNOSIS — E785 Hyperlipidemia, unspecified: Secondary | ICD-10-CM | POA: Diagnosis not present

## 2024-01-04 DIAGNOSIS — Z780 Asymptomatic menopausal state: Secondary | ICD-10-CM

## 2024-01-04 LAB — TSH: TSH: 1.47 u[IU]/mL (ref 0.35–5.50)

## 2024-01-04 LAB — COMPREHENSIVE METABOLIC PANEL WITH GFR
ALT: 6 U/L (ref 0–35)
AST: 16 U/L (ref 0–37)
Albumin: 3.8 g/dL (ref 3.5–5.2)
Alkaline Phosphatase: 133 U/L — ABNORMAL HIGH (ref 39–117)
BUN: 9 mg/dL (ref 6–23)
CO2: 29 meq/L (ref 19–32)
Calcium: 9.8 mg/dL (ref 8.4–10.5)
Chloride: 100 meq/L (ref 96–112)
Creatinine, Ser: 1.15 mg/dL (ref 0.40–1.20)
GFR: 42.66 mL/min — ABNORMAL LOW (ref >60.00)
Glucose, Bld: 132 mg/dL — ABNORMAL HIGH (ref 70–99)
Potassium: 3.1 meq/L — ABNORMAL LOW (ref 3.5–5.1)
Sodium: 142 meq/L (ref 135–145)
Total Bilirubin: 0.5 mg/dL (ref 0.2–1.2)
Total Protein: 7.9 g/dL (ref 6.0–8.3)

## 2024-01-04 LAB — BRAIN NATRIURETIC PEPTIDE: Pro B Natriuretic peptide (BNP): 23 pg/mL (ref 0.0–100.0)

## 2024-01-04 LAB — LIPID PANEL
Cholesterol: 124 mg/dL (ref 0–200)
HDL: 47.3 mg/dL (ref >39.00)
LDL Cholesterol: 58 mg/dL (ref 0–99)
NonHDL: 76.29
Total CHOL/HDL Ratio: 3
Triglycerides: 91 mg/dL (ref 0.0–149.0)
VLDL: 18.2 mg/dL (ref 0.0–40.0)

## 2024-01-04 LAB — CBC
HCT: 39.1 % (ref 36.0–46.0)
Hemoglobin: 13 g/dL (ref 12.0–15.0)
MCHC: 33.3 g/dL (ref 30.0–36.0)
MCV: 88.2 fl (ref 78.0–100.0)
Platelets: 386 K/uL (ref 150.0–400.0)
RBC: 4.43 Mil/uL (ref 3.87–5.11)
RDW: 14.9 % (ref 11.5–15.5)
WBC: 7.2 K/uL (ref 4.0–10.5)

## 2024-01-04 LAB — HEMOGLOBIN A1C: Hgb A1c MFr Bld: 5.9 % (ref 4.6–6.5)

## 2024-01-04 NOTE — Patient Instructions (Signed)
 Nice to see you today I will be in touch with the labs once I have them  Follow up with me in 2 months  Call and schedule ultra sound at   [facility]  [address] [place], [place]  [phone] Open 8am-430pm

## 2024-01-04 NOTE — Assessment & Plan Note (Signed)
 DEXA scan ordered.  Patient and family were given information to call and set up at their convenience

## 2024-01-04 NOTE — Assessment & Plan Note (Signed)
 Pulsatile mass in left upper quadrant.  Query AAA.  Pending US  aorta.  Patient and family were given information to call and set up US  aorta at their convenience.

## 2024-01-04 NOTE — Assessment & Plan Note (Signed)
 Bilateral feet all 10 toes.  Ambulatory furl to podiatry for further evaluation

## 2024-01-04 NOTE — Telephone Encounter (Signed)
 Called and spoke with patient. Gave her contact info for [employer] [address] Bone density.  No further questions or concerns.

## 2024-01-04 NOTE — Assessment & Plan Note (Signed)
 Currently maintained on atorvastatin  20 mg daily.  Pending lipid panel

## 2024-01-04 NOTE — Assessment & Plan Note (Signed)
 Patient currently maintained on amlodipine  10 mg daily.  Blood pressure controlled.  Continue as prescribed

## 2024-01-04 NOTE — Telephone Encounter (Signed)
 She needs to call the [facility] location for bone density. I gave her the information at discharge

## 2024-01-04 NOTE — Telephone Encounter (Signed)
 Copied from CRM #[id number]. Topic: Appointments - Scheduling Inquiry for Clinic >> Jan 04, 2024  1:57 PM Victoria A wrote: Reason for CRM: Patient's relative Sherral Mace called to schedule Bone Density Test [phone] (M)-please call

## 2024-01-04 NOTE — Assessment & Plan Note (Signed)
 Could be multifactorial given patient's history of smoking.  Does sound cardiac in nature.  Patient having no angina but shortness of breath and lightheadedness will decrease and go away when she rests.  Obtain echocardiogram of heart and ambulatory referral to cardiology.  Also checking lipids today

## 2024-01-04 NOTE — Progress Notes (Signed)
 New Patient Office Visit  Subjective    Patient ID: Leah Anderson, female    DOB: 1933-08-15  Age: 89 y.o. MRN: [id number]  CC:  Chief Complaint  Patient presents with   Establish Care    Pt complains of SOB and stomach pains. Pt declines vaccines.    Medication Management    HPI Leah Anderson presents to establish care   HTN: on amlodipiem 10mg  a day and will check it once a week.  Patient denies any dizziness related to blood pressure  HLD: on atorvastaion 20mg  dialy    SHOB: states that she has had some shortness of breath for a couple of months that is stayd the same and worse with exterion and have some lightheaded. States that he is not having chest pain. She did undergo a CTA that did not show a PE  States that she has a hx of cgastritis and she does n0ot have an appetitie. She is eatin once a day. States that she is going back and forth to the bathroom. States that she is going 2-3 times without blood   Colonoscopy: Show discussion patient's mother and sister died of colon cancer.  Patient is 89 years old.  They will consider whether or not to proceed with colonoscopy let me know Mammogram: Declines at current juncture DEXA: Order placed for [facility]  Tdap: Refused Flu: Refused PNA: Refused Shingrix: Refused  Outpatient Encounter Medications as of 01/04/2024  Medication Sig   albuterol (VENTOLIN HFA) 108 (90 Base) MCG/ACT inhaler Inhale into the lungs.   amLODipine  (NORVASC ) 10 MG tablet TAKE 1 TABLET(10 MG) BY MOUTH DAILY   atorvastatin  (LIPITOR) 20 MG tablet TAKE 1 TABLET BY MOUTH EVERY DAY   Blood Glucose Monitoring Suppl (ONETOUCH VERIO REFLECT) w/Device KIT daily.   ibuprofen  (ADVIL ) 600 MG tablet Take 600 mg by mouth 3 (three) times daily.   Lancets (ONETOUCH DELICA PLUS LANCET33G) MISC Apply topically daily.   ONETOUCH VERIO test strip 3 (three) times daily.   traZODone (DESYREL) 150 MG tablet SMARTSIG:1 Tablet(s) By Mouth Every Evening    [DISCONTINUED] ezetimibe  (ZETIA ) 10 MG tablet TAKE 1 TABLET(10 MG) BY MOUTH DAILY   [DISCONTINUED] traZODone (DESYREL) 50 MG tablet Take 100 mg by mouth at bedtime.   [DISCONTINUED] clonazePAM  (KLONOPIN ) 0.5 MG tablet TAKE 1 TABLET BY MOUTH EVERY DAY AS NEEDED FOR ANXIETY (Patient not taking: Reported on 01/04/2024)   [DISCONTINUED] escitalopram  (LEXAPRO ) 20 MG tablet TAKE 1 TABLET(20 MG) BY MOUTH DAILY (Patient not taking: Reported on 01/04/2024)   [DISCONTINUED] hydrochlorothiazide  (HYDRODIURIL ) 25 MG tablet Take 1 tablet (25 mg total) by mouth daily.   [DISCONTINUED] hydrOXYzine  (ATARAX /VISTARIL ) 25 MG tablet Take 1 tablet (25 mg total) by mouth every 8 (eight) hours as needed for anxiety. (Patient not taking: Reported on 01/04/2024)   No facility-administered encounter medications on file as of 01/04/2024.    Past Medical History:  Diagnosis Date   Arthritis    Hyperlipidemia    Hypertension     Past Surgical History:  Procedure Laterality Date   APPENDECTOMY     CHOLECYSTECTOMY     THYROIDECTOMY, PARTIAL      Family History  Problem Relation Age of Onset   Cancer Mother    Cancer Sister     Social History   Socioeconomic History   Marital status: Single    Spouse name: Not on file   Number of children: Not on file   Years of education: Not on file   Highest education  level: Not on file  Occupational History   Not on file  Tobacco Use   Smoking status: Former    Current packs/day: 0.50    Average packs/day: 0.5 packs/day for 25.8 years (12.9 ttl pk-yrs)    Types: Cigarettes    Start date: 02/27/1998    Quit date: 02/27/1962   Smokeless tobacco: Never  Vaping Use   Vaping status: Never Used  Substance and Sexual Activity   Alcohol use: No   Drug use: No   Sexual activity: Not Currently  Other Topics Concern   Not on file  Social History Narrative   Not on file   Social Drivers of Health   Financial Resource Strain: Not on file  Food Insecurity: Not on file   Transportation Needs: Not on file  Physical Activity: Not on file  Stress: Not on file  Social Connections: Not on file  Intimate Partner Violence: Not At Risk (11/24/2023)   Received from [facility]   HITS    Over the last 12 months how often did your partner physically hurt you?: Never    Over the last 12 months how often did your partner insult you or talk down to you?: Never    Over the last 12 months how often did your partner threaten you with physical harm?: Never    Over the last 12 months how often did your partner scream or curse at you?: Never    Review of Systems  Constitutional:  Negative for chills and fever.  Respiratory:  Positive for shortness of breath.   Cardiovascular:  Negative for chest pain and leg swelling.  Gastrointestinal:  Positive for abdominal pain and diarrhea. Negative for blood in stool, constipation, nausea and vomiting.  Genitourinary:  Negative for dysuria and hematuria.  Neurological:  Positive for dizziness. Negative for tingling and headaches.  Psychiatric/Behavioral:  Negative for hallucinations and suicidal ideas.         Objective    BP 132/60   Pulse 80   Temp 97.6 F (36.4 C) (Oral)   Ht 5' 7 (1.702 m)   Wt 178 lb 12.8 oz (81.1 kg)   SpO2 98%   BMI 28.00 kg/m   Physical Exam Vitals and nursing note reviewed.  Constitutional:      Appearance: Normal appearance.  HENT:     Right Ear: Tympanic membrane, ear canal and external ear normal.     Left Ear: Tympanic membrane, ear canal and external ear normal.     Mouth/Throat:     Mouth: Mucous membranes are moist.     Pharynx: Oropharynx is clear.  Eyes:     Extraocular Movements: Extraocular movements intact.     Pupils: Pupils are equal, round, and reactive to light.  Cardiovascular:     Rate and Rhythm: Normal rate and regular rhythm.     Heart sounds: Normal heart sounds.  Pulmonary:     Effort: Pulmonary effort is normal.     Breath sounds: Normal breath sounds.   Abdominal:     General: Bowel sounds are normal.     Palpations: There is no mass.     Tenderness: There is abdominal tenderness in the epigastric area.  Musculoskeletal:     Right lower leg: No edema.     Left lower leg: No edema.  Lymphadenopathy:     Cervical: No cervical adenopathy.  Neurological:     General: No focal deficit present.     Mental Status: She is alert.  Comments: Bilateral upper and lower extremity strength 5/5         Assessment & Plan:   Problem List Items Addressed This Visit       Cardiovascular and Mediastinum   Essential hypertension   Patient currently maintained on amlodipine  10 mg daily.  Blood pressure controlled.  Continue as prescribed      Relevant Orders   CBC   Comprehensive metabolic panel with GFR   TSH   ECHOCARDIOGRAM COMPLETE   Ambulatory referral to Podiatry   Cardiomegaly   Finding on recent CT angio chest.  Will order echocardiogram to assess cardiomegaly and heart function in the setting of DOE      Relevant Orders   ECHOCARDIOGRAM COMPLETE     Musculoskeletal and Integument   Enlarged and hypertrophic nails   Bilateral feet all 10 toes.  Ambulatory furl to podiatry for further evaluation      Relevant Orders   Ambulatory referral to Podiatry     Other   Hyperlipidemia   Currently maintained on atorvastatin  20 mg daily.  Pending lipid panel      Relevant Orders   Hemoglobin A1c   Lipid panel   Abdominal mass - Primary   Pulsatile mass in left upper quadrant.  Query AAA.  Pending US  aorta.  Patient and family were given information to call and set up US  aorta at their convenience.      Relevant Orders   US  AORTA   DOE (dyspnea on exertion)   Could be multifactorial given patient's history of smoking.  Does sound cardiac in nature.  Patient having no angina but shortness of breath and lightheadedness will decrease and go away when she rests.  Obtain echocardiogram of heart and ambulatory referral to  cardiology.  Also checking lipids today      Relevant Orders   ECHOCARDIOGRAM COMPLETE   Ambulatory referral to Cardiology   Brain natriuretic peptide   Post-menopausal   DEXA scan ordered.  Patient and family were given information to call and set up at their convenience      Relevant Orders   DG Bone Density   Other Visit Diagnoses       Screening for diabetes mellitus       Relevant Orders   Hemoglobin A1c       Return in about 2 months (around 03/05/2024) for DOE. Bp recheck .   Adina Crandall, NP

## 2024-01-04 NOTE — Assessment & Plan Note (Signed)
 Finding on recent CT angio chest.  Will order echocardiogram to assess cardiomegaly and heart function in the setting of DOE

## 2024-01-07 ENCOUNTER — Ambulatory Visit (INDEPENDENT_AMBULATORY_CARE_PROVIDER_SITE_OTHER)
Admission: RE | Admit: 2024-01-07 | Discharge: 2024-01-07 | Disposition: A | Discharge status: Home / Self Care | Source: Ambulatory Visit | Attending: Nurse Practitioner | Admitting: Nurse Practitioner

## 2024-01-07 DIAGNOSIS — Z780 Asymptomatic menopausal state: Secondary | ICD-10-CM | POA: Diagnosis not present

## 2024-01-09 ENCOUNTER — Ambulatory Visit: Payer: Self-pay | Admitting: Nurse Practitioner

## 2024-01-09 DIAGNOSIS — M810 Age-related osteoporosis without current pathological fracture: Secondary | ICD-10-CM

## 2024-01-09 DIAGNOSIS — E876 Hypokalemia: Secondary | ICD-10-CM

## 2024-01-09 MED ORDER — POTASSIUM CHLORIDE CRYS ER 20 MEQ PO TBCR: 20.0000 meq | EXTENDED_RELEASE_TABLET | Freq: Two times a day (BID) | ORAL | 0 refills | Status: DC

## 2024-01-11 ENCOUNTER — Other Ambulatory Visit

## 2024-01-11 MED ORDER — ALENDRONATE SODIUM 70 MG PO TABS: 70.0000 mg | ORAL_TABLET | ORAL | 11 refills | Status: DC

## 2024-01-14 ENCOUNTER — Ambulatory Visit: Admitting: Podiatry

## 2024-01-14 ENCOUNTER — Encounter: Payer: Self-pay | Admitting: Podiatry

## 2024-01-14 DIAGNOSIS — B351 Tinea unguium: Secondary | ICD-10-CM

## 2024-01-14 DIAGNOSIS — M79671 Pain in right foot: Secondary | ICD-10-CM | POA: Diagnosis not present

## 2024-01-14 DIAGNOSIS — M7752 Other enthesopathy of left foot: Secondary | ICD-10-CM

## 2024-01-14 DIAGNOSIS — M79672 Pain in left foot: Secondary | ICD-10-CM

## 2024-01-14 NOTE — Progress Notes (Signed)
 Patient presents for evaluation and treatment of tenderness and some redness around nails feet.  Tenderness around toes with walking and wearing shoes.  Physical exam:  General appearance: Alert, pleasant, and in no acute distress.  Vascular: Pedal pulses: DP 2/4 B/L, PT 1/4 B/L.  Mild to moderate edema lower legs bilaterally.  Capillary refill time immediate bilaterally  Neurologic:  Dermatologic:  Nails thickened, disfigured, discolored 1-5 BL with subungual debris.  Redness and hypertrophic nail folds along nail folds bilaterally but no signs of drainage or infection.  Skin thin atrophic no hair growth lower extremity bilaterally.  These are areas of hyperpigmentation.  Musculoskeletal:  Tenderness at distal aspect of the second toe of the left.  Hammertoe deformity 2 through 5 bilaterally.  Normal muscle strength lower extremity bilaterally  Diagnosis: 1. Painful onychomycotic nails 1 through 5 bilaterally. 2. Pain toes 1 through 5 bilaterally. 3.  Bursitis second toe left  Plan: -New patient office visit for evaluation and management level 3.  Modifier 25. - Discussed over the tenderness at the distal aspect of the second toe left.  Pressure on the end of the toe.  There is a slight preulcerative area there with irritation and skin thinning.  No signs of infection.  Wear good well cushioned socks and shoes with a deep toebox. -Debrided onychomycotic nails 1 through 5 bilaterally.  Sharply debrided nails with nail clipper and reduced with a power bur.  Return 3 months [facility]

## 2024-01-15 ENCOUNTER — Telehealth: Payer: Self-pay | Admitting: Podiatry

## 2024-01-15 NOTE — Telephone Encounter (Signed)
 Patient states that toe fungus was discussed at visit. What is the treatment plan ?

## 2024-01-21 ENCOUNTER — Ambulatory Visit
Admission: RE | Admit: 2024-01-21 | Discharge: 2024-01-21 | Disposition: A | Discharge status: Home / Self Care | Source: Ambulatory Visit | Attending: Nurse Practitioner | Admitting: Nurse Practitioner

## 2024-01-21 DIAGNOSIS — R1900 Intra-abdominal and pelvic swelling, mass and lump, unspecified site: Secondary | ICD-10-CM

## 2024-02-14 ENCOUNTER — Ambulatory Visit (HOSPITAL_COMMUNITY)
Admission: RE | Admit: 2024-02-14 | Discharge: 2024-02-14 | Attending: Cardiovascular Disease | Admitting: Cardiovascular Disease

## 2024-02-14 DIAGNOSIS — I10 Essential (primary) hypertension: Secondary | ICD-10-CM

## 2024-02-14 DIAGNOSIS — R0600 Dyspnea, unspecified: Secondary | ICD-10-CM | POA: Diagnosis not present

## 2024-02-14 DIAGNOSIS — R0609 Other forms of dyspnea: Secondary | ICD-10-CM | POA: Diagnosis present

## 2024-02-14 DIAGNOSIS — I517 Cardiomegaly: Secondary | ICD-10-CM | POA: Diagnosis present

## 2024-02-14 LAB — ECHOCARDIOGRAM COMPLETE
AR max vel: 1.97 cm2
AV Area VTI: 1.76 cm2
AV Area mean vel: 1.8 cm2
AV Mean grad: 9 mmHg
AV Peak grad: 17.5 mmHg
Ao pk vel: 2.09 m/s
Area-P 1/2: 3.28 cm2
S' Lateral: 2.6 cm

## 2024-02-18 ENCOUNTER — Ambulatory Visit: Attending: Physician Assistant | Admitting: Physician Assistant

## 2024-02-18 VITALS — BP 130/70 | HR 74 | Ht 67.0 in | Wt 175.8 lb

## 2024-02-18 DIAGNOSIS — R0609 Other forms of dyspnea: Secondary | ICD-10-CM | POA: Diagnosis not present

## 2024-02-18 DIAGNOSIS — I10 Essential (primary) hypertension: Secondary | ICD-10-CM

## 2024-02-18 NOTE — Patient Instructions (Signed)
 Medication Instructions:  Your physician recommends that you continue on your current medications as directed. Please refer to the Current Medication list given to you today.  *If you need a refill on your cardiac medications before your next appointment, please call your pharmacy*  Lab Work: None ordered  If you have labs (blood work) drawn today and your tests are completely normal, you will receive your results only by: MyChart Message (if you have MyChart) OR A paper copy in the mail If you have any lab test that is abnormal or we need to change your treatment, we will call you to review the results.  Testing/Procedures: Your physician has requested that you have a lexiscan myoview. For further information please visit [email or web address] Please follow instruction sheet, BELOW:    You are scheduled for a Myocardial Perfusion Imaging Study on:  Please arrive 15 minutes prior to your appointment time for registration and insurance purposes.  The test will take approximately 3 to 4 hours to complete; you may bring reading material.  If someone comes with you to your appointment, they will need to remain in the main lobby due to limited space in the testing area. **If you are pregnant or breastfeeding, please notify the nuclear lab prior to your appointment**  How to prepare for your Myocardial Perfusion Test: Do not eat or drink 3 hours prior to your test, except you may have water. Do not consume products containing caffeine (regular or decaffeinated) 12 hours prior to your test. (ex: coffee, chocolate, sodas, tea). Do bring a list of your current medications with you.  If not listed below, you may take your medications as normal. Do wear comfortable clothes (no dresses or overalls) and walking shoes, tennis shoes preferred (No heels or open toe shoes are allowed). Do NOT wear cologne, perfume, aftershave, or lotions (deodorant is allowed). If these instructions are not followed, your  test will have to be rescheduled.   You have been referred to [facility] PULMONOLOGY, THEY WILL CALL YOU TO SCHEDULE AN APPOINTMENT.   Follow-Up: At [facility], you and your health needs are our priority.  As part of our continuing mission to provide you with exceptional heart care, our providers are all part of one team.  This team includes your primary Cardiologist (physician) and Advanced Practice Providers or APPs (Physician Assistants and Nurse Practitioners) who all work together to provide you with the care you need, when you need it.  Your next appointment:   BASED UPON YOUR TEST RESULTS  Provider:   Scot Ford, PA-C          We recommend signing up for the patient portal called MyChart.  Sign up information is provided on this After Visit Summary.  MyChart is used to connect with patients for Virtual Visits (Telemedicine).  Patients are able to view lab/test results, encounter notes, upcoming appointments, etc.  Non-urgent messages can be sent to your provider as well.   To learn more about what you can do with MyChart, go to [email or web address].   Other Instructions

## 2024-02-18 NOTE — Progress Notes (Unsigned)
 " Cardiology Office Note   Date:  02/18/2024  ID:  Leah Anderson, DOB 1934-01-02, MRN [id number] PCP: Wendee Lynwood HERO, NP   HeartCare Providers Cardiologist:  [facility] - DOD Dr. [place]ira  History of Present Illness Leah Anderson is a 89 y.o. female who has past medical history of hypertension, anxiety/depression, former smoker, hyperlipidemia, thyroid  disease, and osteoarthritis of knee. Patient was seen at [facility] emergency room on 11/24/2023 due to high blood pressure and lower abdominal pain for 1 week.  Chest x-ray showed enlarged cardiomediastinal silhouette, no pulmonary venous congestion, bilateral mild subsegmental pulmonary opacities suggestive of possible atelectasis and less likely developing infiltrate.  CT of abdomen and pelvis suggested possible gastroenteritis/pancolitis with gastric pneumatosis, no sign of bowel obstruction, moderate cardiomegaly, 3 cm fusiform infrarenal abdominal aortic aneurysm, mild diffuse urinary wall thickening which may represent cystitis.  She was later seen at [facility], ED on 12/20/2023 for increased cough and shortness of breath.  Viral panel was negative for flu RSV and COVID.  Blood work showed potassium 3.3, creatinine 1.0, D-dimer elevated at 3.3, lipase negative.  proBNP at 297.  CBC normal.  Troponin negative.  CTA of the chest for elevated D-dimer came back showing no PE, mild diffuse bronchial wall thickening concerning for bronchitis, increased caliber of the main pulmonary artery suggestive of pulmonary hypertension, cardiac enlargement, aortic atherosclerosis and a coronary artery calcification.  Since the ED visit, patient was seen by her PCP on 01/04/2024 for shortness of breath has been going on for several months and loss of appetite.  Abdominal ultrasound showed distal abdominal aortic aneurysm measuring at 3.2 cm.  Bone density test suggestive of osteoporosis.  Blood work showed BNP of 23.  Normal CBC, TSH.  CMP showed  potassium 3.1, alkaline phosphatase 133.  Hemoglobin A1c 5.9.  Lipid panel showed total cholesterol 124, triglyceride 91, HDL 47, LDL 58.  Subsequent echocardiogram obtained on 02/14/2024 showed EF 65 to 70%, no regional wall motion abnormality, mild asymmetric LVH of the basal septal segment, grade 1 DD, normal RV, RVSP 51.6 mmHg, mild LAE, mild aortic stenosis.  She presented to [facility] evaluation for dyspnea on exertion.  She says her shortness of breath has been going on for many years and is progressively worsening.  She is getting short of breath walking from the bedroom to the bathroom.  She denies any chest discomfort.  She started smoking at age 46 and quit in the recent years.  She has never been seen by a pulmonologist.  I reviewed the recent echocardiogram with the patient.  Given elevated RVSP, my suspicion is she has significant pulmonary issue.  I cannot rule out the possibility of anginal equivalent, will obtain Lexiscan Myoview.  However if Lexiscan Myoview is negative, would focus more on pulmonary evaluation and she can follow-up with cardiology service as needed.  I will refer the patient to pulmonology service.   ROS:   She denies chest pain, palpitations, pnd, orthopnea, n, v, dizziness, syncope, edema, weight gain, or early satiety. All other systems reviewed and are otherwise negative except as noted above.    Patient complains of dyspnea on exertion   Studies Reviewed      Cardiac Studies & Procedures   ______________________________________________________________________________________________     ECHOCARDIOGRAM  ECHOCARDIOGRAM COMPLETE 02/14/2024  Narrative ECHOCARDIOGRAM REPORT    Patient Name:   Leah Anderson Date of Exam: 02/14/2024 Medical Rec #:  [id number]     Height:       67.0 in  Accession #:    [id number]    Weight:       178.8 lb Date of Birth:  1933/07/10     BSA:          1.928 m Patient Age:    89 years      BP:           132/60  mmHg Patient Gender: F             HR:           66 bpm. Exam Location:  [address]  Procedure: 2D Echo, 3D Echo, Color Doppler, Cardiac Doppler and Strain Analysis (Both Spectral and Color Flow Doppler were utilized during procedure).  Indications:    R06.00 Dyspnea  History:        Patient has no prior history of Echocardiogram examinations. Risk Factors:Hypertension and Dyslipidemia.  Sonographer:    Powell Saras Referring Phys: [id number] LYNWOOD HERO CABLE  IMPRESSIONS   1. Left ventricular ejection fraction, by estimation, is 65 to 70%. Left ventricular ejection fraction by 3D volume is 65 %. The left ventricle has normal function. The left ventricle has no regional wall motion abnormalities. There is mild asymmetric left ventricular hypertrophy of the basal-septal segment. Left ventricular diastolic parameters are consistent with Grade I diastolic dysfunction (impaired relaxation). The average left ventricular global longitudinal strain is -19.7 %. The global longitudinal strain is normal. 2. Right ventricular systolic function is normal. The right ventricular size is normal. There is moderately elevated pulmonary artery systolic pressure. The estimated right ventricular systolic pressure is 51.6 mmHg. 3. Left atrial size was mildly dilated. 4. The mitral valve is normal in structure. No evidence of mitral valve regurgitation. 5. The aortic valve is tricuspid. There is mild calcification of the aortic valve. There is mild thickening of the aortic valve. Aortic valve regurgitation is not visualized. Mild aortic valve stenosis. 6. The inferior vena cava is dilated in size with >50% respiratory variability, suggesting right atrial pressure of 8 mmHg.  FINDINGS Left Ventricle: Left ventricular ejection fraction, by estimation, is 65 to 70%. Left ventricular ejection fraction by 3D volume is 65 %. The left ventricle has normal function. The left ventricle has no regional wall motion  abnormalities. The average left ventricular global longitudinal strain is -19.7 %. Strain was performed and the global longitudinal strain is normal. The left ventricular internal cavity size was normal in size. There is mild asymmetric left ventricular hypertrophy of the basal-septal segment. Left ventricular diastolic parameters are consistent with Grade I diastolic dysfunction (impaired relaxation). Indeterminate filling pressures.  Right Ventricle: The right ventricular size is normal. No increase in right ventricular wall thickness. Right ventricular systolic function is normal. There is moderately elevated pulmonary artery systolic pressure. The tricuspid regurgitant velocity is 3.30 m/s, and with an assumed right atrial pressure of 8 mmHg, the estimated right ventricular systolic pressure is 51.6 mmHg.  Left Atrium: Left atrial size was mildly dilated.  Right Atrium: Right atrial size was normal in size.  Pericardium: There is no evidence of pericardial effusion.  Mitral Valve: The mitral valve is normal in structure. No evidence of mitral valve regurgitation.  Tricuspid Valve: The tricuspid valve is normal in structure. Tricuspid valve regurgitation is mild.  Aortic Valve: The aortic valve is tricuspid. There is mild calcification of the aortic valve. There is mild thickening of the aortic valve. Aortic valve regurgitation is not visualized. Mild aortic stenosis is present. Aortic valve mean gradient measures 9.0 mmHg. Aortic  valve peak gradient measures 17.5 mmHg. Aortic valve area, by VTI measures 1.76 cm.  Pulmonic Valve: The pulmonic valve was normal in structure. Pulmonic valve regurgitation is not visualized. No evidence of pulmonic stenosis.  Aorta: The aortic root and ascending aorta are structurally normal, with no evidence of dilitation.  Venous: The inferior vena cava is dilated in size with greater than 50% respiratory variability, suggesting right atrial pressure of 8  mmHg.  IAS/Shunts: No atrial level shunt detected by color flow Doppler.  Additional Comments: 3D was performed not requiring image post processing on an independent workstation and was normal.   LEFT VENTRICLE PLAX 2D LVIDd:         3.90 cm         Diastology LVIDs:         2.60 cm         LV e' medial:    6.00 cm/s LV PW:         0.90 cm         LV E/e' medial:  11.3 LV IVS:        1.50 cm         LV e' lateral:   8.52 cm/s LVOT diam:     2.10 cm         LV E/e' lateral: 7.9 LV SV:         77 LV SV Index:   40              2D Longitudinal LVOT Area:     3.46 cm        Strain 2D Strain GLS   -20.4 % (A4C): 2D Strain GLS   -21.0 % (A3C): 2D Strain GLS   -17.7 % (A2C): 2D Strain GLS   -19.7 % Avg:  3D Volume EF LV 3D EF:    Left ventricul ar ejection fraction by 3D volume is 65 %.  3D Volume EF: 3D EF:        65 % LV EDV:       113 ml LV ESV:       40 ml LV SV:        74 ml  RIGHT VENTRICLE             IVC RV Basal diam:  3.70 cm     IVC diam: 2.60 cm RV S prime:     13.20 cm/s TAPSE (M-mode): 2.0 cm  LEFT ATRIUM             Index        RIGHT ATRIUM           Index LA diam:        3.90 cm 2.02 cm/m   RA Area:     16.80 cm LA Vol (A2C):   55.2 ml 28.63 ml/m  RA Volume:   43.30 ml  22.46 ml/m LA Vol (A4C):   57.3 ml 29.72 ml/m LA Biplane Vol: 57.6 ml 29.87 ml/m AORTIC VALVE AV Area (Vmax):    1.97 cm AV Area (Vmean):   1.80 cm AV Area (VTI):     1.76 cm AV Vmax:           209.00 cm/s AV Vmean:          140.000 cm/s AV VTI:            0.439 m AV Peak Grad:      17.5 mmHg AV Mean Grad:      9.0 mmHg  LVOT Vmax:         119.00 cm/s LVOT Vmean:        72.900 cm/s LVOT VTI:          0.223 m LVOT/AV VTI ratio: 0.51  AORTA Ao Root diam: 2.90 cm Ao Asc diam:  3.00 cm  MITRAL VALVE               TRICUSPID VALVE MV Area (PHT): 3.28 cm    TR Peak grad:   43.6 mmHg MV Decel Time: 231 msec    TR Vmax:        330.00 cm/s MV E velocity: 67.70 cm/s MV  A velocity: 73.90 cm/s  SHUNTS MV E/A ratio:  0.92        Systemic VTI:  0.22 m Systemic Diam: 2.10 cm  Mihai Croitoru MD Electronically signed by Jerel Balding MD Signature Date/Time: 02/14/2024/6:36:06 PM    Final          ______________________________________________________________________________________________      Risk Assessment/Calculations           Physical Exam VS:  BP 130/70   Pulse 74   Ht 5' 7 (1.702 m)   Wt 175 lb 12.8 oz (79.7 kg)   SpO2 95%   BMI 27.53 kg/m        Wt Readings from Last 3 Encounters:  02/18/24 175 lb 12.8 oz (79.7 kg)  01/04/24 178 lb 12.8 oz (81.1 kg)  05/11/20 219 lb (99.3 kg)    GEN: Well nourished, well developed in no acute distress NECK: No JVD; No carotid bruits CARDIAC: RRR, no murmurs, rubs, gallops RESPIRATORY:  Clear to auscultation without rales, wheezing or rhonchi  ABDOMEN: Soft, non-tender, non-distended EXTREMITIES:  No edema; No deformity   ASSESSMENT AND PLAN  Dyspnea on exertion: Recent echocardiogram was normal.  Will obtain Lexiscan Myoview to rule out anginal equivalent.  Patient smoked since she was 89 years old and quit in the recent years.  I have high suspicion that her shortness of breath may be related to underlying undiagnosed lung issue.  I will refer her to a pulmonologist as well.  Hypertension: Blood pressure well-controlled.      Informed Consent   Shared Decision Making/Informed Consent The risks [chest pain, shortness of breath, cardiac arrhythmias, dizziness, blood pressure fluctuations, myocardial infarction, stroke/transient ischemic attack, nausea, vomiting, allergic reaction, radiation exposure, metallic taste sensation and life-threatening complications (estimated to be 1 in 10,000)], benefits (risk stratification, diagnosing coronary artery disease, treatment guidance) and alternatives of a nuclear stress test were discussed in detail with Ms. Kendra and she agrees to proceed.      Dispo: Follow-up with cardiology service as needed if stress test came back normal.  Signed, Scot Ford, PA  "

## 2024-02-19 ENCOUNTER — Telehealth (HOSPITAL_COMMUNITY): Payer: Self-pay | Admitting: *Deleted

## 2024-02-19 NOTE — Telephone Encounter (Signed)
 Left message on voicemail in reference to upcoming appointment scheduled for 02/27/24 Phone number given for a call back so details instructions can be given. Claudene Ronal Quale, RN

## 2024-02-22 ENCOUNTER — Other Ambulatory Visit: Payer: Self-pay | Admitting: Physician Assistant

## 2024-02-22 DIAGNOSIS — R0609 Other forms of dyspnea: Secondary | ICD-10-CM

## 2024-02-27 ENCOUNTER — Ambulatory Visit (HOSPITAL_COMMUNITY)
Admission: RE | Admit: 2024-02-27 | Source: Ambulatory Visit | Attending: Physician Assistant | Admitting: Physician Assistant

## 2024-03-05 ENCOUNTER — Ambulatory Visit: Admitting: Nurse Practitioner

## 2024-03-14 ENCOUNTER — Ambulatory Visit (INDEPENDENT_AMBULATORY_CARE_PROVIDER_SITE_OTHER)
Admission: RE | Admit: 2024-03-14 | Discharge: 2024-03-14 | Disposition: A | Discharge status: Home / Self Care | Source: Ambulatory Visit | Attending: Nurse Practitioner | Admitting: Nurse Practitioner

## 2024-03-14 ENCOUNTER — Ambulatory Visit: Admitting: Nurse Practitioner

## 2024-03-14 VITALS — BP 122/62 | HR 88 | Temp 97.6°F | Ht 67.0 in | Wt 169.0 lb

## 2024-03-14 DIAGNOSIS — I10 Essential (primary) hypertension: Secondary | ICD-10-CM

## 2024-03-14 DIAGNOSIS — R0609 Other forms of dyspnea: Secondary | ICD-10-CM | POA: Diagnosis not present

## 2024-03-14 DIAGNOSIS — R1084 Generalized abdominal pain: Secondary | ICD-10-CM

## 2024-03-14 NOTE — Progress Notes (Signed)
 "  Established Patient Office Visit  Subjective   Patient ID: Leah Anderson, female    DOB: 20-Apr-1933  Age: 89 y.o. MRN: [id number]  Chief Complaint  Patient presents with   Follow-up    HPI  Discussed the use of AI scribe software for clinical note transcription with the patient, who gave verbal consent to proceed.  History of Present Illness Leah Anderson is an 89 year old female with hypertension who presents with gastrointestinal issues and shortness of breath. She is accompanied by her son.  She has been experiencing gastrointestinal issues for the past two to three weeks, characterized by alternating diarrhea and constipation. Bowel movements range from loose stools to small, firm 'nuggets or balls.' She experiences abdominal pain, particularly in the lower abdomen, and occasional urgency without reaching the bathroom in time. No nausea, vomiting, or fever. She has not had a bowel movement today but had a small one the previous day. Her diet includes cereal, milk, coffee, prunes, and a lot of water.  She reports ongoing shortness of breath, unchanged since her last visit. She has not yet seen the pulmonologist she was referred to by her cardiologist. A recent nuclear medicine stress test was performed, but results are not available at this clinic.  She takes medication for cholesterol and bones. Her son mentions she uses an asthma inhaler and diabetes medication, although she is not diabetic.  She feels generally unwell and weak, particularly when getting up. Her son encourages more movement, but she feels weak and prefers to sit.     Review of Systems  Constitutional:  Negative for chills and fever.  Respiratory:  Positive for shortness of breath.   Cardiovascular:  Negative for chest pain.  Gastrointestinal:  Positive for abdominal pain, constipation and diarrhea. Negative for blood in stool, nausea and vomiting.  Neurological:  Negative for dizziness and headaches.       Objective:     BP 122/62   Pulse 88   Temp 97.6 F (36.4 C) (Oral)   Ht 5' 7 (1.702 m)   Wt 169 lb (76.7 kg)   SpO2 96%   BMI 26.47 kg/m  BP Readings from Last 3 Encounters:  03/14/24 122/62  02/18/24 130/70  01/04/24 132/60   Wt Readings from Last 3 Encounters:  03/14/24 169 lb (76.7 kg)  02/18/24 175 lb 12.8 oz (79.7 kg)  01/04/24 178 lb 12.8 oz (81.1 kg)   SpO2 Readings from Last 3 Encounters:  03/14/24 96%  02/18/24 95%  01/04/24 98%      Physical Exam Vitals and nursing note reviewed.  Constitutional:      Appearance: Normal appearance.  Cardiovascular:     Rate and Rhythm: Normal rate and regular rhythm.     Heart sounds: Normal heart sounds.  Pulmonary:     Effort: Pulmonary effort is normal.     Breath sounds: Normal breath sounds.  Abdominal:     General: Bowel sounds are normal. There is no distension.     Tenderness: There is abdominal tenderness in the epigastric area, periumbilical area and suprapubic area.  Neurological:     Mental Status: She is alert.      No results found for any visits on 03/14/24.    The ASCVD Risk score (Arnett DK, et al., 2019) failed to calculate for the following reasons:   The 2019 ASCVD risk score is only valid for ages 53 to 59   * - Cholesterol units were assumed    Assessment & Plan:  Problem List Items Addressed This Visit       Cardiovascular and Mediastinum   Essential hypertension - Primary     Other   DOE (dyspnea on exertion)   Other Visit Diagnoses       Generalized abdominal pain       Relevant Orders   CBC   Comprehensive metabolic panel with GFR   Lipase   DG Abd 1 View   Urinalysis w microscopic + reflex cultur      Assessment and Plan Assessment & Plan Abdominal pain with bowel habit changes Intermittent abdominal pain with alternating stool consistency. Possible constipation with overflow diarrhea. - Ordered abdominal x-ray to assess for constipation. - Obtained urine  sample for urinalysis. - Ordered blood work to evaluate liver, kidney, and pancreatic function.  Essential hypertension Blood pressure well-controlled on current medication. - Continue amlodipine  10 mg oral daily.  Hyperlipidemia Cholesterol levels managed with current medication. - Continue atorvastatin  20 mg oral daily.  Dyspnea on exertion Shortness of breath unchanged. Echocardiogram and nuclear stress test results pending. Referral to pulmonologist made.  Urinary incontinence Inability to hold urine and stool with occasional urgency and pain. No dysuria. - Check UA  Return in about 6 weeks (around 04/25/2024) for weight recheck .    Adina Crandall, NP  "

## 2024-03-14 NOTE — Patient Instructions (Signed)
 Nice to see you today I will be in touch with the labs and xray once I have it Follow up with me in 6 weeks for weight recheck

## 2024-03-15 LAB — LIPASE: Lipase: <5 U/L — ABNORMAL LOW (ref 7–60)

## 2024-03-15 LAB — COMPREHENSIVE METABOLIC PANEL WITH GFR
AG Ratio: 1.1 (calc) (ref 1.0–2.5)
ALT: 14 U/L (ref 6–29)
AST: 21 U/L (ref 10–35)
Albumin: 3.7 g/dL (ref 3.6–5.1)
Alkaline phosphatase (APISO): 136 U/L (ref 37–153)
BUN/Creatinine Ratio: 9 (calc) (ref 6–22)
BUN: 10 mg/dL (ref 7–25)
CO2: 21 mmol/L (ref 20–32)
Calcium: 9.2 mg/dL (ref 8.6–10.4)
Chloride: 102 mmol/L (ref 98–110)
Creat: 1.13 mg/dL — ABNORMAL HIGH (ref 0.60–0.95)
Globulin: 3.5 g/dL (ref 1.9–3.7)
Glucose, Bld: 131 mg/dL — ABNORMAL HIGH (ref 65–99)
Potassium: 2.9 mmol/L — ABNORMAL LOW (ref 3.5–5.3)
Sodium: 142 mmol/L (ref 135–146)
Total Bilirubin: 0.8 mg/dL (ref 0.2–1.2)
Total Protein: 7.2 g/dL (ref 6.1–8.1)
eGFR: 47 mL/min/1.73m2 — ABNORMAL LOW

## 2024-03-15 LAB — CBC
HCT: 40.5 % (ref 35.9–46.0)
Hemoglobin: 13.8 g/dL (ref 11.7–15.5)
MCH: 29.2 pg (ref 27.0–33.0)
MCHC: 34.1 g/dL (ref 31.6–35.4)
MCV: 85.6 fL (ref 81.4–101.7)
MPV: 10.3 fL (ref 7.5–12.5)
Platelets: 438 Thousand/uL — ABNORMAL HIGH (ref 140–400)
RBC: 4.73 Million/uL (ref 3.80–5.10)
RDW: 13.9 % (ref 11.0–15.0)
WBC: 5.8 Thousand/uL (ref 3.8–10.8)

## 2024-03-16 ENCOUNTER — Ambulatory Visit: Payer: Self-pay | Admitting: Nurse Practitioner

## 2024-03-16 DIAGNOSIS — E876 Hypokalemia: Secondary | ICD-10-CM

## 2024-03-16 MED ORDER — POTASSIUM CHLORIDE CRYS ER 20 MEQ PO TBCR: 40.0000 meq | EXTENDED_RELEASE_TABLET | Freq: Every day | ORAL | 0 refills | Status: AC

## 2024-04-03 ENCOUNTER — Ambulatory Visit

## 2024-04-03 VITALS — BP 128/64 | HR 66 | Temp 97.8°F | Ht 67.0 in | Wt 166.2 lb

## 2024-04-03 DIAGNOSIS — Z0000 Encounter for general adult medical examination without abnormal findings: Secondary | ICD-10-CM

## 2024-04-03 NOTE — Patient Instructions (Signed)
 Leah Anderson,  Thank you for taking the time for your Medicare Wellness Visit. I appreciate your continued commitment to your health goals. Please review the care plan we discussed, and feel free to reach out if I can assist you further.  Please note that Annual Wellness Visits do not include a physical exam. Some assessments may be limited, especially if the visit was conducted virtually. If needed, we may recommend an in-person follow-up with your provider.  Ongoing Care Seeing your primary care provider every 3 to 6 months helps us  monitor your health and provide consistent, personalized care.   Referrals If a referral was made during today's visit and you haven't received any updates within two weeks, please contact the referred provider directly to check on the status.  Recommended Screenings:  Health Maintenance  Topic Date Due   Medicare Annual Wellness Visit  Never done   DTaP/Tdap/Td vaccine (1 - Tdap) Never done   Zoster (Shingles) Vaccine (1 of 2) Never done   Pneumococcal Vaccine for age over 45 (2 of 2 - PCV20 or PCV21) 06/21/2018   COVID-19 Vaccine (1 - 2025-26 season) Never done   Flu Shot  05/27/2024*   Osteoporosis screening with Bone Density Scan  Completed   Meningitis B Vaccine  Aged Out  *Topic was postponed. The date shown is not the original due date.       04/03/2024   11:29 AM  Advanced Directives  Does Patient Have a Medical Advance Directive? No  Would patient like information on creating a medical advance directive? No - Patient declined    Vision: Annual vision screenings are recommended for early detection of glaucoma, cataracts, and diabetic retinopathy. These exams can also reveal signs of chronic conditions such as diabetes and high blood pressure.  Dental: Annual dental screenings help detect early signs of oral cancer, gum disease, and other conditions linked to overall health, including heart disease and diabetes.  Please see the attached  documents for additional preventive care recommendations.

## 2024-04-03 NOTE — Progress Notes (Cosign Needed)
 "  Chief Complaint  Patient presents with   Medicare Wellness     Subjective:   Leah Anderson is a 89 y.o. female who presents for a Medicare Annual Wellness Visit.  Visit info / Clinical Intake: Medicare Wellness Visit Type:: Initial Annual Wellness Visit Persons participating in visit and providing information:: patient Medicare Wellness Visit Mode:: In-person (required for WTM) Interpreter Needed?: No Pre-visit prep was completed: yes AWV questionnaire completed by patient prior to visit?: no Living arrangements:: (!) lives alone Patient's Overall Health Status Rating: (!) poor Typical amount of pain: (!) a lot Does pain affect daily life?: (!) yes Are you currently prescribed opioids?: no  Dietary Habits and Nutritional Risks How many meals a day?: (!) 1 Eats fruit and vegetables daily?: (!) no Most meals are obtained by: preparing own meals In the last 2 weeks, have you had any of the following?: (!) nausea, vomiting, diarrhea (diarrhea) Diabetic:: no  Functional Status Activities of Daily Living (to include ambulation/medication): Independent Ambulation: Independent Medication Administration: Independent Home Management (perform basic housework or laundry): Independent Manage your own finances?: (!) no (son manages) Primary transportation is: family / friends Concerns about vision?: no *vision screening is required for WTM* Concerns about hearing?: no  Fall Screening Falls in the past year?: 0 Number of falls in past year: 0 Was there an injury with Fall?: 0 Fall Risk Category Calculator: 0 Patient Fall Risk Level: Low Fall Risk  Fall Risk Patient at Risk for Falls Due to: Medication side effect Fall risk Follow up: Falls prevention discussed; Education provided; Falls evaluation completed  Home and Transportation Safety: All rugs have non-skid backing?: yes All stairs or steps have railings?: yes Grab bars in the bathtub or shower?: (!) no Have non-skid  surface in bathtub or shower?: yes Good home lighting?: yes Regular seat belt use?: yes Hospital stays in the last year:: no  Cognitive Assessment Difficulty concentrating, remembering, or making decisions? : no Will 6CIT or Mini Cog be Completed: yes What year is it?: 0 points What month is it?: 0 points Give patient an address phrase to remember (5 components): [address] [place] About what time is it?: 3 points Count backwards from 20 to 1: 0 points Say the months of the year in reverse: 4 points Repeat the address phrase from earlier: 10 points 6 CIT Score: 17 points  Advance Directives (For Healthcare) Does Patient Have a Medical Advance Directive?: No Would patient like information on creating a medical advance directive?: No - Patient declined  Reviewed/Updated  Reviewed/Updated: Reviewed All (Medical, Surgical, Family, Medications, Allergies, Care Teams, Patient Goals)    Allergies (verified) Patient has no known allergies.   Current Medications (verified) Outpatient Encounter Medications as of 04/03/2024  Medication Sig   amLODipine  (NORVASC ) 10 MG tablet TAKE 1 TABLET(10 MG) BY MOUTH DAILY   atorvastatin  (LIPITOR) 20 MG tablet TAKE 1 TABLET BY MOUTH EVERY DAY   traZODone (DESYREL) 150 MG tablet SMARTSIG:1 Tablet(s) By Mouth Every Evening   Blood Glucose Monitoring Suppl (ONETOUCH VERIO REFLECT) w/Device KIT daily. (Patient not taking: Reported on 04/03/2024)   Lancets (ONETOUCH DELICA PLUS LANCET33G) MISC Apply topically daily. (Patient not taking: Reported on 04/03/2024)   ONETOUCH VERIO test strip 3 (three) times daily. (Patient not taking: Reported on 04/03/2024)   potassium chloride  SA (KLOR-CON  M) 20 MEQ tablet Take 2 tablets (40 mEq total) by mouth daily. For 2 days (Patient not taking: Reported on 04/03/2024)   No facility-administered encounter medications on  file as of 04/03/2024.    History: Past Medical History:  Diagnosis Date   Arthritis     Hyperlipidemia    Hypertension    Past Surgical History:  Procedure Laterality Date   APPENDECTOMY     CHOLECYSTECTOMY     THYROIDECTOMY, PARTIAL     Family History  Problem Relation Age of Onset   Cancer Mother    Cancer Sister    Social History   Occupational History   Not on file  Tobacco Use   Smoking status: Former    Current packs/day: 0.50    Average packs/day: 0.5 packs/day for 26.1 years (13.0 ttl pk-yrs)    Types: Cigarettes    Start date: 02/27/1998    Quit date: 02/27/1962   Smokeless tobacco: Never  Vaping Use   Vaping status: Never Used  Substance and Sexual Activity   Alcohol use: No   Drug use: No   Sexual activity: Not Currently   Tobacco Counseling Counseling given: Not Answered  SDOH Screenings   Food Insecurity: Food Insecurity Present (04/03/2024)  Housing: Low Risk (04/03/2024)  Transportation Needs: No Transportation Needs (04/03/2024)  Utilities: Not At Risk (04/03/2024)  Alcohol Screen: Low Risk (04/03/2024)  Depression (PHQ2-9): Medium Risk (04/03/2024)  Financial Resource Strain: Low Risk (04/03/2024)  Physical Activity: Inactive (04/03/2024)  Social Connections: Socially Isolated (04/03/2024)  Stress: Stress Concern Present (04/03/2024)  Tobacco Use: Medium Risk (04/03/2024)  Health Literacy: Adequate Health Literacy (04/03/2024)   See flowsheets for full screening details  Depression Screen PHQ 2 & 9 Depression Scale- Over the past 2 weeks, how often have you been bothered by any of the following problems? Little interest or pleasure in doing things: 3 Feeling down, depressed, or hopeless (PHQ Adolescent also includes...irritable): 1 PHQ-2 Total Score: 4 Trouble falling or staying asleep, or sleeping too much: 3 Feeling tired or having little energy: 3 Poor appetite or overeating (PHQ Adolescent also includes...weight loss): 0 Feeling bad about yourself - or that you are a failure or have let yourself or your family down: 0 Trouble concentrating on  things, such as reading the newspaper or watching television (PHQ Adolescent also includes...like school work): 0 Moving or speaking so slowly that other people could have noticed. Or the opposite - being so fidgety or restless that you have been moving around a lot more than usual: 0 Thoughts that you would be better off dead, or of hurting yourself in some way: 0 PHQ-9 Total Score: 10 If you checked off any problems, how difficult have these problems made it for you to do your work, take care of things at home, or get along with other people?: Somewhat difficult  Depression Treatment Depression Interventions/Treatment : Patient refuses Treatment     Goals Addressed               This Visit's Progress     drink more water (pt-stated)               Objective:    Today's Vitals   04/03/24 1114  BP: 128/64  Pulse: 66  Temp: 97.8 F (36.6 C)  TempSrc: Oral  SpO2: 97%  Weight: 166 lb 3.2 oz (75.4 kg)  Height: 5' 7 (1.702 m)   Body mass index is 26.03 kg/m.  Hearing/Vision screen Vision Screening - Comments:: No regular eye exams Immunizations and Health Maintenance Health Maintenance  Topic Date Due   DTaP/Tdap/Td (1 - Tdap) Never done   Zoster Vaccines- Shingrix (1 of 2) Never  done   Pneumococcal Vaccine: 50+ Years (2 of 2 - PCV20 or PCV21) 06/21/2018   COVID-19 Vaccine (1 - 2025-26 season) Never done   Influenza Vaccine  05/27/2024 (Originally 09/28/2023)   Medicare Annual Wellness (AWV)  04/03/2025   Bone Density Scan  Completed   Meningococcal B Vaccine  Aged Out        Assessment/Plan:  This is a routine wellness examination for Leah Anderson.  Patient Care Team: Wendee Lynwood HERO, NP as PCP - General (Nurse Practitioner)  I have personally reviewed and noted the following in the patients chart:   Medical and social history Use of alcohol, tobacco or illicit drugs  Current medications and supplements including opioid prescriptions. Functional ability and  status Nutritional status Physical activity Advanced directives List of other physicians Hospitalizations, surgeries, and ER visits in previous 12 months Vitals Screenings to include cognitive, depression, and falls Referrals and appointments  No orders of the defined types were placed in this encounter.  In addition, I have reviewed and discussed with patient certain preventive protocols, quality metrics, and best practice recommendations. A written personalized care plan for preventive services as well as general preventive health recommendations were provided to patient.   Leah FORBES Dawn, LPN   08/31/7971   Return in 1 year (on 04/03/2025).  After Visit Summary: (In Person-Printed) AVS printed and given to the patient  Nurse Notes: Vaccines not given: all vaccines declined today HM Addressed: Number given for meals on wheels. Appointment made to see Mr. Cable on 05/16/2024 per patient request. Message sent to Mr. Cable in regards to some concerns from patient.  "

## 2024-04-15 ENCOUNTER — Ambulatory Visit: Admitting: Podiatry

## 2024-04-22 ENCOUNTER — Ambulatory Visit

## 2024-05-16 ENCOUNTER — Ambulatory Visit: Admitting: Nurse Practitioner

## 2025-04-09 ENCOUNTER — Ambulatory Visit
